# Patient Record
Sex: Female | Born: 1990 | Race: Black or African American | Hispanic: No | Marital: Single | Smoking: Never smoker
Health system: Southern US, Community
[De-identification: ages and names within clinical notes are randomized; demographics above are authoritative.]

## PROBLEM LIST (undated history)

## (undated) DIAGNOSIS — R112 Nausea with vomiting, unspecified: Secondary | ICD-10-CM

## (undated) DIAGNOSIS — Z8679 Personal history of other diseases of the circulatory system: Secondary | ICD-10-CM

## (undated) DIAGNOSIS — Z973 Presence of spectacles and contact lenses: Secondary | ICD-10-CM

## (undated) DIAGNOSIS — R102 Pelvic and perineal pain: Secondary | ICD-10-CM

## (undated) DIAGNOSIS — G43909 Migraine, unspecified, not intractable, without status migrainosus: Secondary | ICD-10-CM

## (undated) DIAGNOSIS — Z8489 Family history of other specified conditions: Secondary | ICD-10-CM

## (undated) DIAGNOSIS — Z9889 Other specified postprocedural states: Secondary | ICD-10-CM

## (undated) DIAGNOSIS — J45909 Unspecified asthma, uncomplicated: Secondary | ICD-10-CM

## (undated) HISTORY — PX: NO PAST SURGERIES: SHX2092

## (undated) HISTORY — PX: REFRACTIVE SURGERY: SHX103

## (undated) HISTORY — PX: OTHER SURGICAL HISTORY: SHX169

---
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
## 2009-04-25 ENCOUNTER — Ambulatory Visit: Payer: Self-pay | Admitting: Internal Medicine

## 2009-04-28 ENCOUNTER — Emergency Department (HOSPITAL_COMMUNITY): Admission: EM | Admit: 2009-04-28 | Discharge: 2009-04-28 | Payer: Self-pay | Admitting: Emergency Medicine

## 2009-04-30 ENCOUNTER — Telehealth (INDEPENDENT_AMBULATORY_CARE_PROVIDER_SITE_OTHER): Payer: Self-pay | Admitting: *Deleted

## 2009-05-06 ENCOUNTER — Ambulatory Visit: Payer: Self-pay | Admitting: Family Medicine

## 2009-05-10 ENCOUNTER — Telehealth: Payer: Self-pay | Admitting: Internal Medicine

## 2009-05-22 ENCOUNTER — Telehealth: Payer: Self-pay | Admitting: Internal Medicine

## 2009-05-24 ENCOUNTER — Ambulatory Visit: Payer: Self-pay | Admitting: Internal Medicine

## 2009-05-27 ENCOUNTER — Telehealth: Payer: Self-pay | Admitting: Internal Medicine

## 2009-10-15 IMAGING — CR DG CHEST 2V
2 series · 2 of 2 positions shown · non-contrast
Comparison: Chest x-ray of 07/18/2007

CLINICAL DATA: Recent cough

CHEST - 2 VIEW

[view not recorded (1 of 2)]
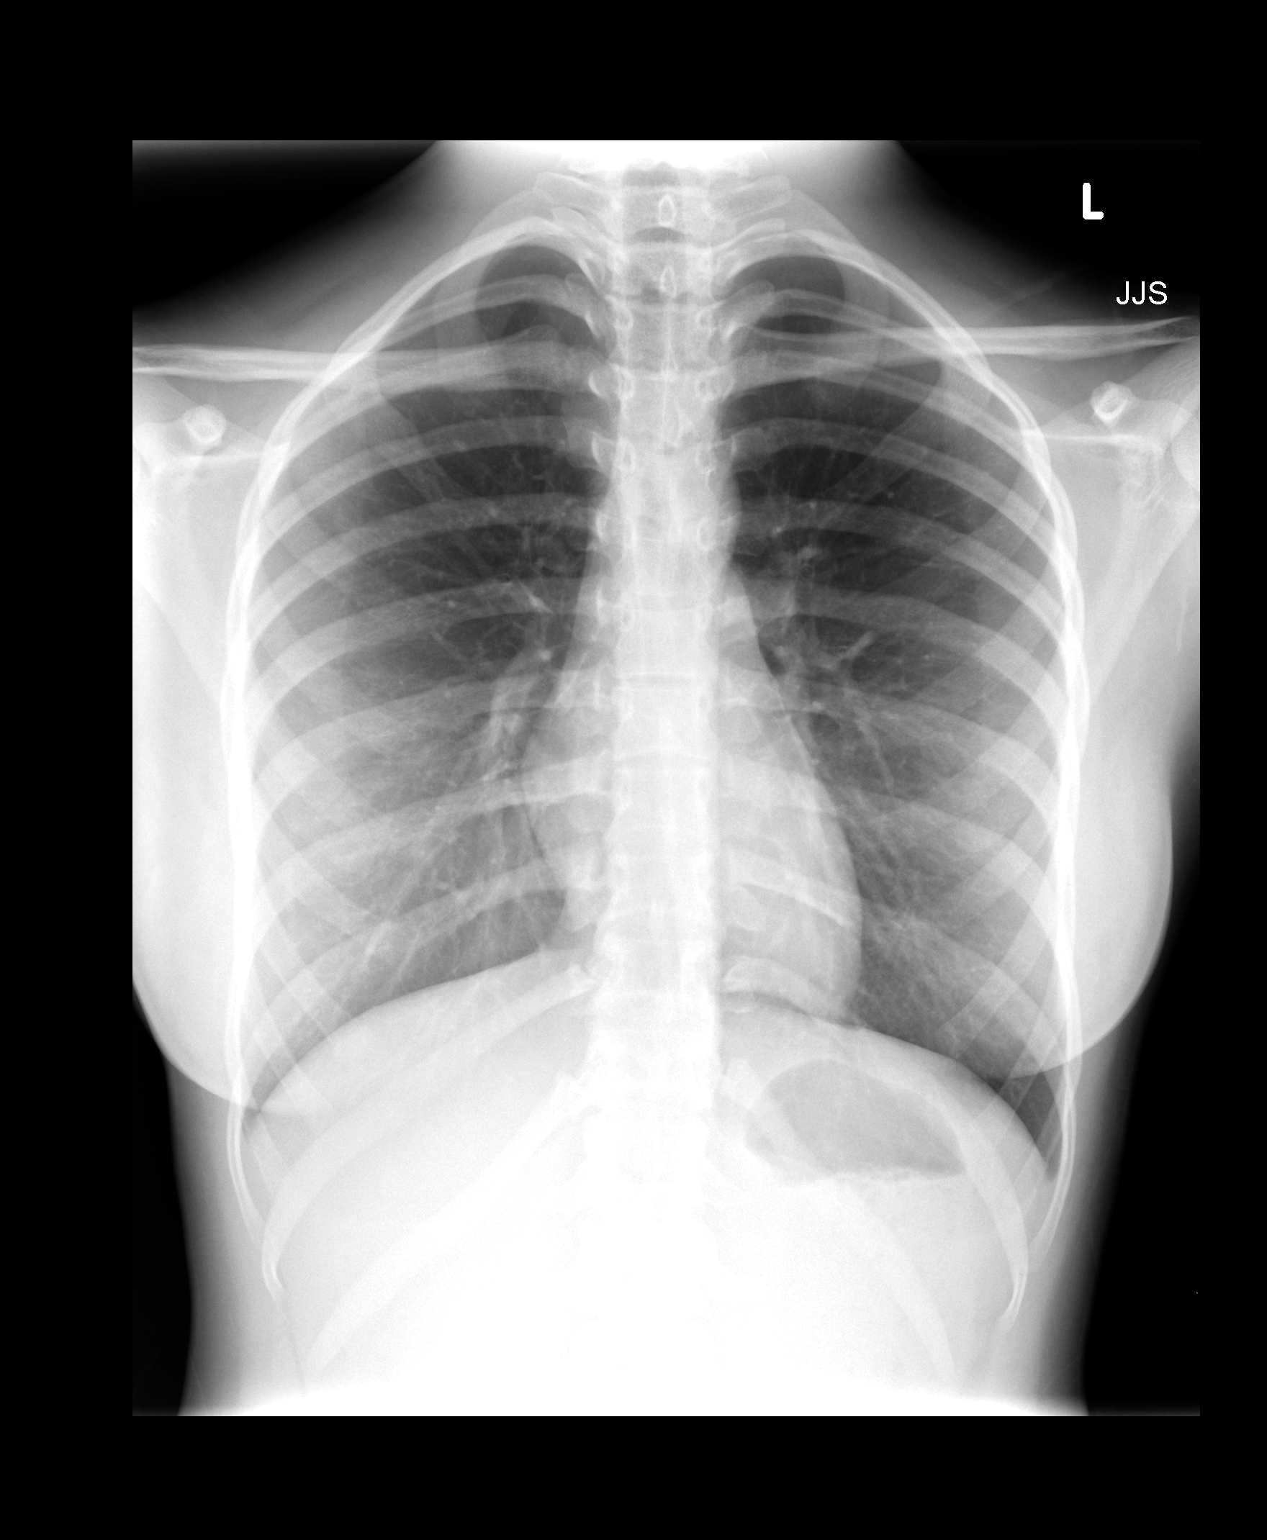

[view not recorded (2 of 2)]
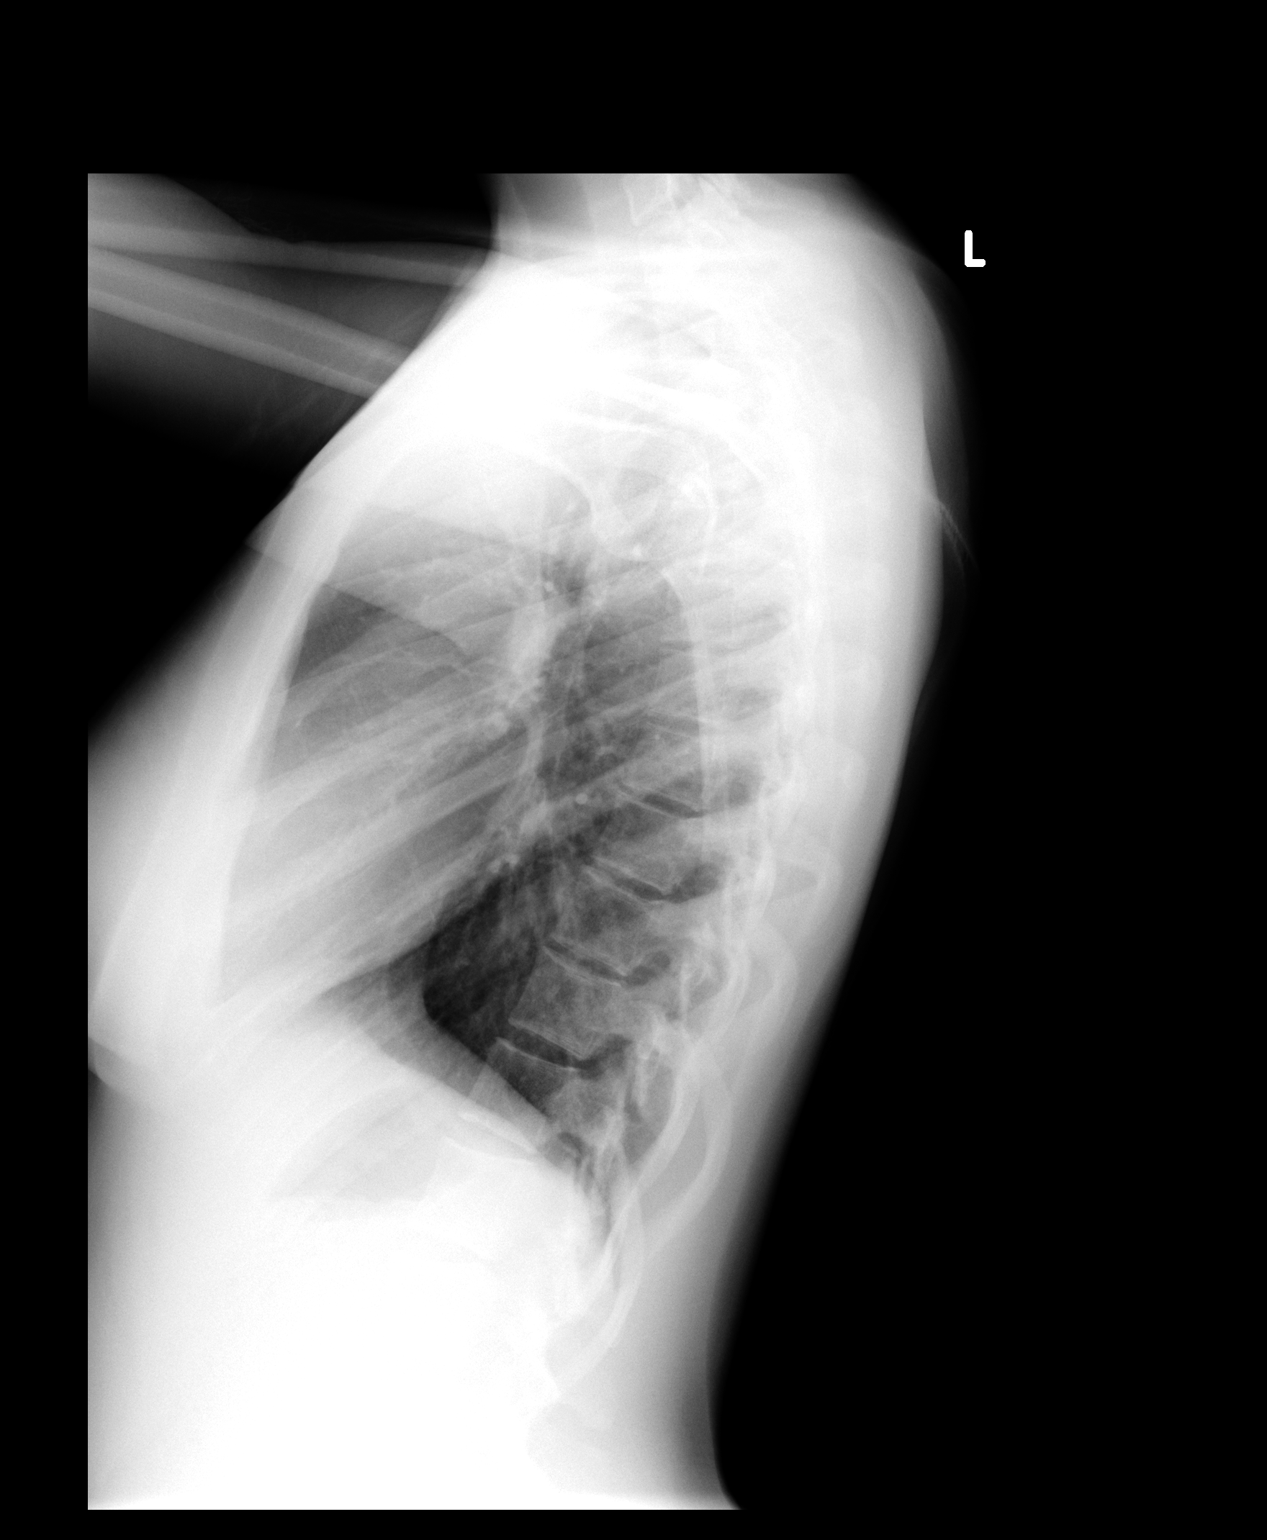

[2 of 2 positions shown; findings below may reference images not displayed]

FINDINGS: The lungs are clear.  Heart is within normal limits in
size.  No bony abnormality is seen.
IMPRESSION: No active lung disease.

## 2009-12-23 ENCOUNTER — Ambulatory Visit: Payer: Self-pay | Admitting: Internal Medicine

## 2010-03-29 IMAGING — CR DG CHEST 2V
2 series · 2 of 2 positions shown · non-contrast
Comparison: 06/26/2008.

CLINICAL DATA: Tachycardia.  Chest pain.

CHEST - 2 VIEW

[w chest pa]
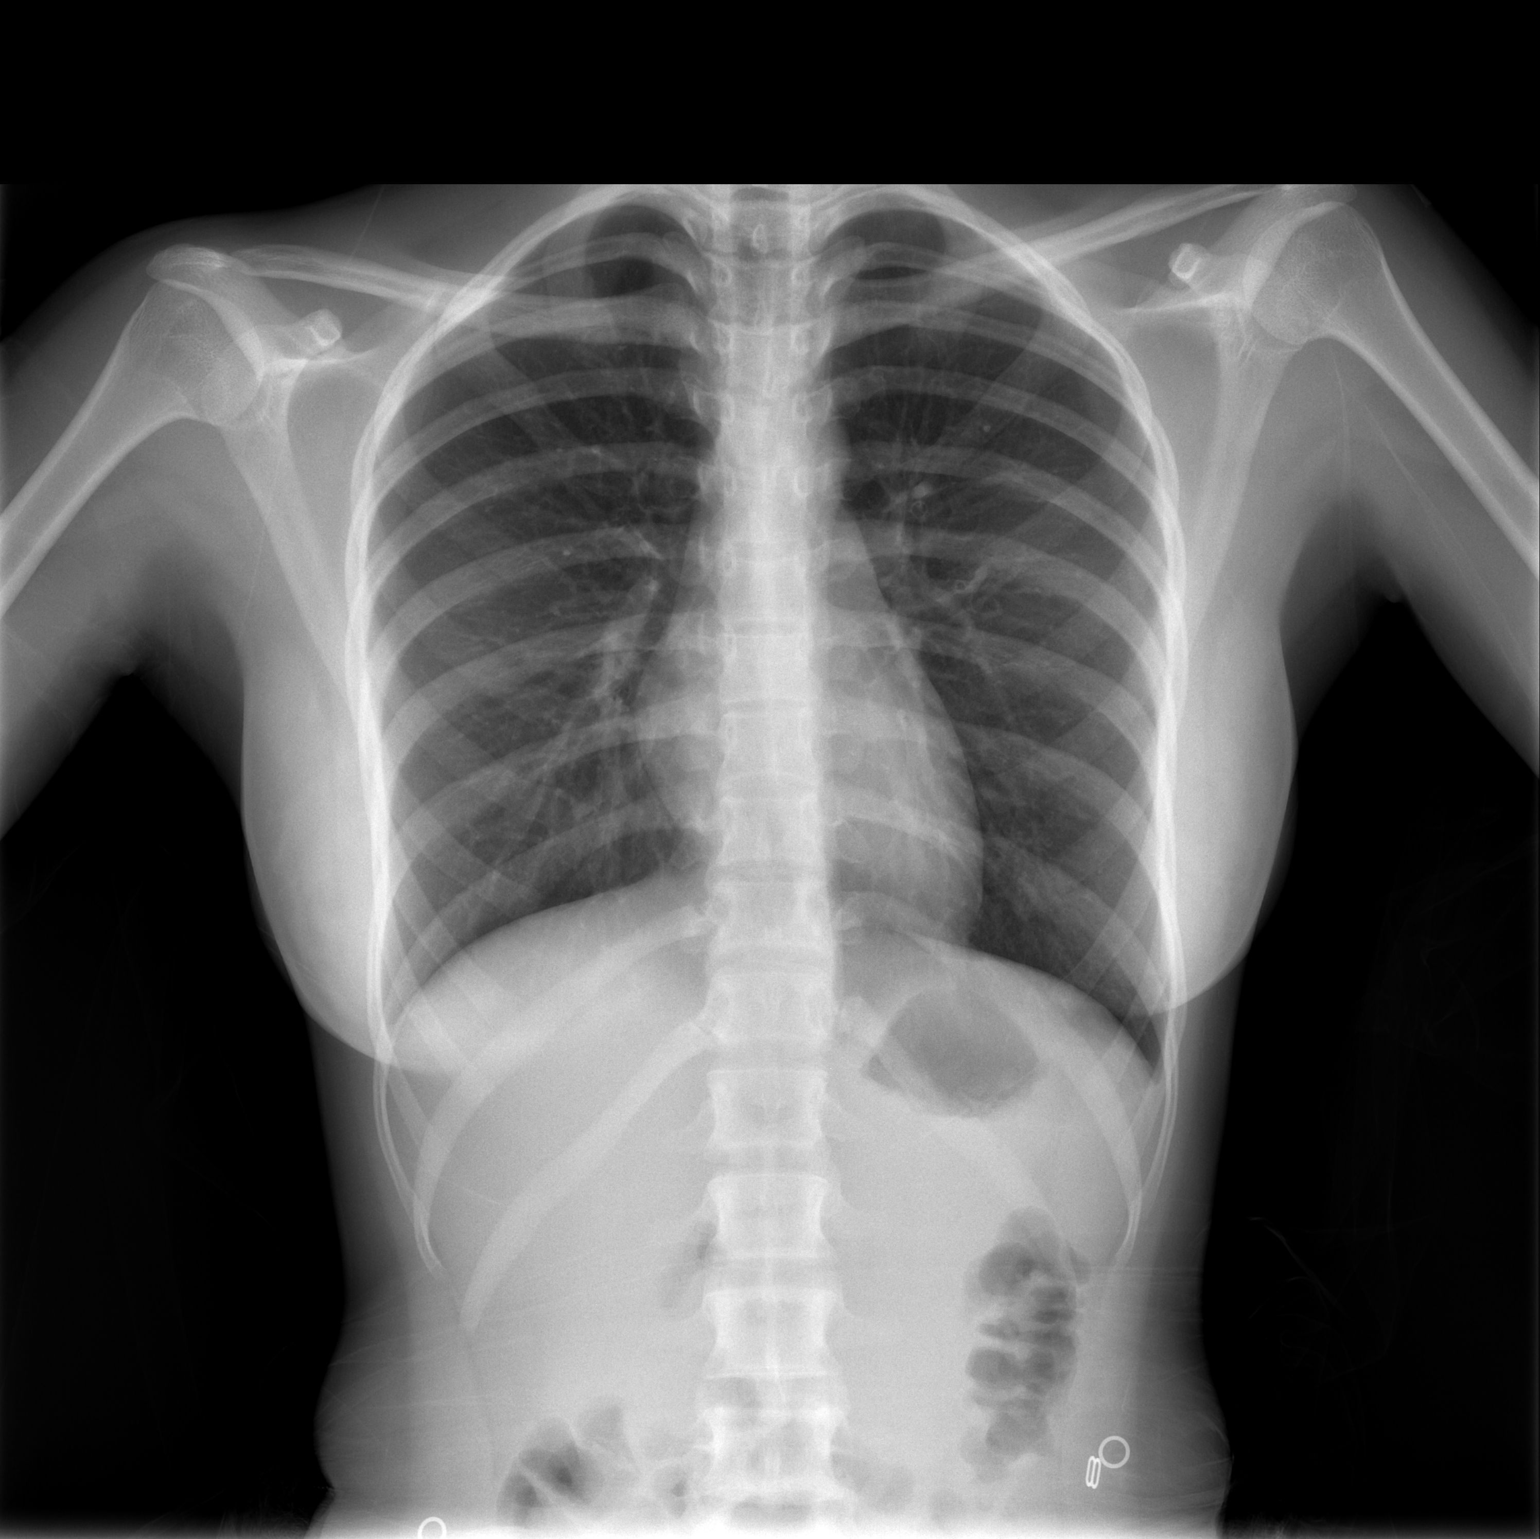

[w chest lat *]
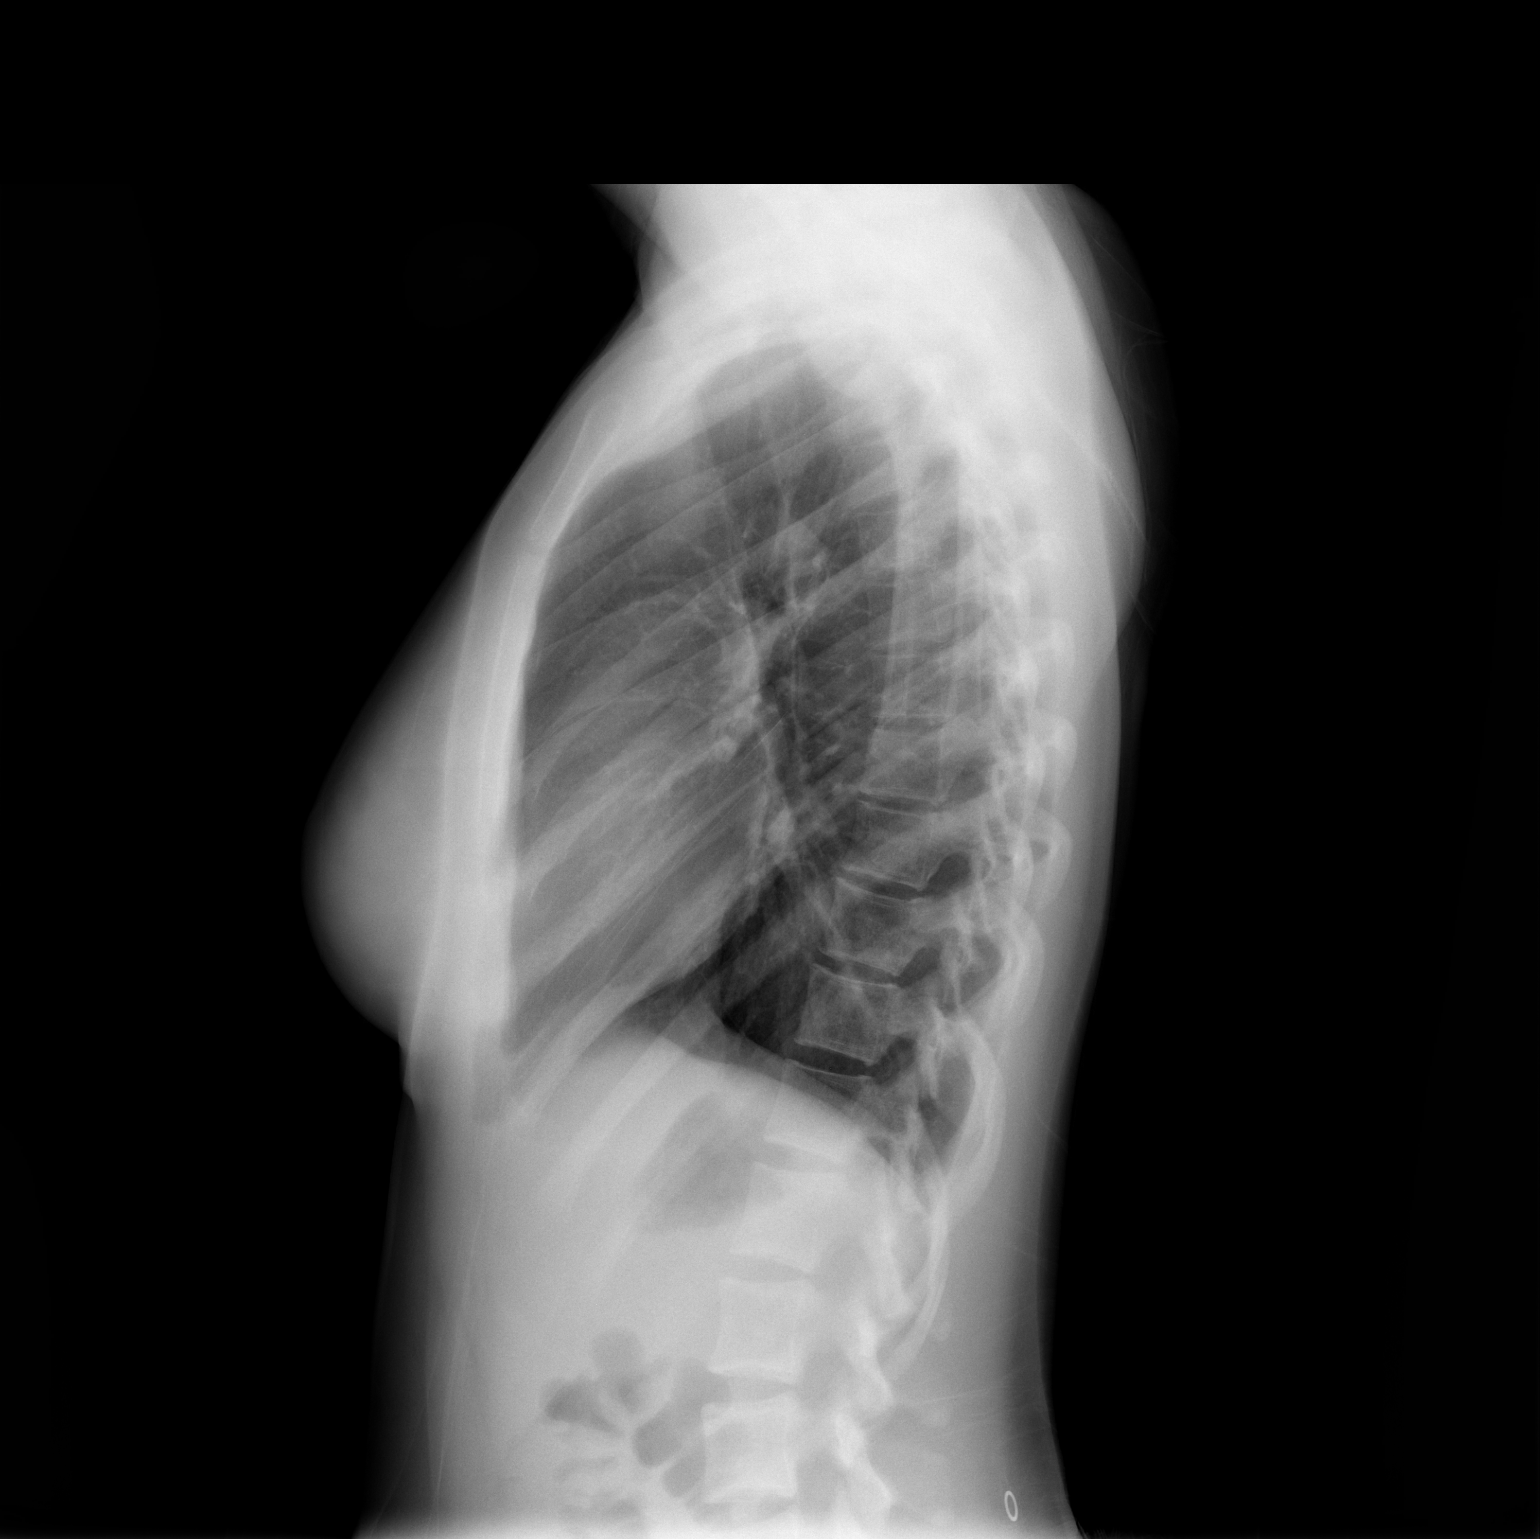

[2 of 2 positions shown; findings below may reference images not displayed]

FINDINGS: The cardiopericardial silhouette is within normal limits
for size.  The lungs are clear.  The visualized soft tissues and
bony thorax are unremarkable.
IMPRESSION: No acute cardiopulmonary disease.

## 2010-04-25 ENCOUNTER — Ambulatory Visit: Payer: Self-pay | Admitting: Internal Medicine

## 2010-04-25 ENCOUNTER — Encounter: Payer: Self-pay | Admitting: Internal Medicine

## 2010-04-29 ENCOUNTER — Ambulatory Visit: Payer: Self-pay | Admitting: Family Medicine

## 2010-11-27 NOTE — Assessment & Plan Note (Signed)
Summary: tb test/njr/pt rsc/cjr  Nurse Visit   Vitals Entered By: Duard Brady LPN (April 25, 8294 11:05 AM)  Allergies: No Known Drug Allergies  Immunizations Administered:  PPD Skin Test:    Vaccine Type: PPD    Site: right forearm    Mfr: Sanofi Pasteur    Dose: 0.1 ml    Route: ID    Given by: Duard Brady LPN    Exp. Date: 08/08/2011    Lot #: [id number]  Orders Added: 1)  TB Skin Test [86580] 2)  Admin 1st Vaccine [phone]

## 2010-11-27 NOTE — Assessment & Plan Note (Signed)
Summary: kim read tb test/cjr  Nurse Visit   Allergies: No Known Drug Allergies  PPD Results    Date of reading: 04/29/2010    Results: < 5mm    Interpretation: negative  Orders Added: 1)  No Charge Patient Arrived (NCPA0) [NCPA0]

## 2010-11-27 NOTE — Letter (Signed)
Summary: [facility]   Imported By: Maryln Gottron 05/29/2010 15:44:19  _____________________________________________________________________  External Attachment:    Type:   Image     Comment:   External Document

## 2010-11-27 NOTE — Assessment & Plan Note (Signed)
Summary: COUGH, CONGESTION // RS   Vital Signs:  Patient profile:   20 year old female Weight:      87 pounds Temp:     97.5 degrees F oral BP sitting:   110 / 78  (right arm) Cuff size:   regular  Vitals Entered By: Duard Brady LPN (December 23, 2009 3:47 PM) CC: c/o cough, congestion, fatigue, fever 100- 101, SOB Is Patient Diabetic? No   CC:  c/o cough, congestion, fatigue, fever 100- 101, and SOB.  History of Present Illness: 20 year old patient who presents with a 3-day  history of fever, cough, congestion, and minimal shortness of breath.  She does history allergic rhinitis and asthma, but the eyes any wheezing denies any sore throat.  She feels a bit weak and fatigued.  She states her temperature has been as high as 101 degrees.  She has been using Tylenol with benefit.  Preventive Screening-Counseling & Management  Alcohol-Tobacco     Smoking Status: never  Allergies (verified): No Known Drug Allergies  Past History:  Past Medical History: Allergic Rhinitis remote hx of eval for syncope neg with cards ? if had slight murmur Sees Derm and Gyne    Hx of pregnancy Asthma  Review of Systems       The patient complains of anorexia, fever, hoarseness, and prolonged cough.  The patient denies weight loss, weight gain, vision loss, decreased hearing, chest pain, syncope, dyspnea on exertion, peripheral edema, headaches, hemoptysis, abdominal pain, melena, hematochezia, severe indigestion/heartburn, hematuria, incontinence, genital sores, muscle weakness, suspicious skin lesions, transient blindness, difficulty walking, depression, unusual weight change, abnormal bleeding, enlarged lymph nodes, angioedema, and breast masses.    Physical Exam  General:  Well-developed,well-nourished,in no acute distress; alert,appropriate and cooperative throughout examination Head:  Normocephalic and atraumatic without obvious abnormalities. No apparent alopecia or balding. Eyes:   No corneal or conjunctival inflammation noted. EOMI. Perrla. Funduscopic exam benign, without hemorrhages, exudates or papilledema. Vision grossly normal. Ears:  External ear exam shows no significant lesions or deformities.  Otoscopic examination reveals clear canals, tympanic membranes are intact bilaterally without bulging, retraction, inflammation or discharge. Hearing is grossly normal bilaterally. Nose:  External nasal examination shows no deformity or inflammation. Nasal mucosa are pink and moist without lesions or exudates. Mouth:  Oral mucosa and oropharynx without lesions or exudates.  Teeth in good repair. Neck:  No deformities, masses, or tenderness noted. Lungs:  Normal respiratory effort, chest expands symmetrically. Lungs are clear to auscultation, no crackles or wheezes. Heart:  Normal rate and regular rhythm. S1 and S2 normal without gallop, murmur, click, rub or other extra sounds.   Impression & Recommendations:  Problem # 1:  ASTHMA (ICD-493.90)  Problem # 2:  URI (ICD-465.9)  Her updated medication list for this problem includes:    Allegra 180 Mg Tabs (Fexofenadine hcl) .[name]... As needed    Hydromet 5-1.5 Mg/76ml Syrp (Hydrocodone-homatropine) .[name]... 1 tsp by mouth two times a day as needed cough    Benzonatate 200 Mg Caps (Benzonatate) .[name]... 1 q 6 hour as needed cough  Her updated medication list for this problem includes:    Allegra 180 Mg Tabs (Fexofenadine hcl) .[name]... As needed    Hydromet 5-1.5 Mg/59ml Syrp (Hydrocodone-homatropine) .[name]... 1 tsp by mouth two times a day as needed cough    Benzonatate 200 Mg Caps (Benzonatate) .[name]... 1 q 6 hour as needed cough  Complete Medication List: 1)  Allegra 180 Mg Tabs (Fexofenadine hcl) .... As needed 2)  Nasacort Aq 55 Mcg/act Aers (Triamcinolone acetonide(nasal)) .... As needed 3)  Hydromet 5-1.5 Mg/9ml Syrp (Hydrocodone-homatropine) .[name].. 1 tsp by mouth two times a day as needed cough 4)  Benzonatate 200 Mg Caps (Benzonatate)  .[name].. 1 q 6 hour as needed cough  Patient Instructions: 1)  Get plenty of rest, drink lots of clear liquids, and use Tylenol or Ibuprofen for fever and comfort. Return in 7-10 days if you're not better:sooner if you're feeling worse. Prescriptions: HYDROMET 5-1.5 MG/5ML SYRP (HYDROCODONE-HOMATROPINE) 1 tsp by mouth two times a day as needed cough  #4 oz x 1   Entered and Authorized by:   Gordy Savers  MD   Signed by:   Gordy Savers  MD on 12/23/2009   Method used:   Print then Give to Patient   RxID:   [id number] NASACORT AQ 55 MCG/ACT  AERS (TRIAMCINOLONE ACETONIDE(NASAL)) as needed  #3 x 3   Entered and Authorized by:   Gordy Savers  MD   Signed by:   Gordy Savers  MD on 12/23/2009   Method used:   Print then Give to Patient   RxID:   [id number] ALLEGRA 180 MG  TABS (FEXOFENADINE HCL) as needed  #90 x 3   Entered and Authorized by:   Gordy Savers  MD   Signed by:   Gordy Savers  MD on 12/23/2009   Method used:   Print then Give to Patient   RxID:   [id number]

## 2010-11-27 NOTE — Letter (Signed)
Summary: Out of School  [place] [facility]  [address] [place], [place] [address]   Phone: [phone]  Fax: [phone]    December 23, 2009   Student:  Kylie Olson    To Whom It May Concern:   For Medical reasons, please excuse the above named student from school for the following dates:  Start:   December 23, 2009  End:    December 25, 2009  If you need additional information, please feel free to contact our office.   Sincerely,    Gordy Savers  MD    ****This is a legal document and cannot be tampered with.  Schools are authorized to verify all information and to do so accordingly.

## 2011-02-10 LAB — CBC
HCT: 41.6 % (ref 36.0–46.0)
Hemoglobin: 13.5 g/dL (ref 12.0–15.0)
MCV: 84.9 fL (ref 78.0–100.0)
Platelets: 252 10*3/uL (ref 150–400)
RDW: 14.1 % (ref 11.5–15.5)

## 2011-02-10 LAB — DIFFERENTIAL
Basophils Relative: 1 % (ref 0–1)
Eosinophils Absolute: 0 10*3/uL (ref 0.0–0.7)
Lymphocytes Relative: 4 % — ABNORMAL LOW (ref 12–46)
Monocytes Absolute: 0.3 10*3/uL (ref 0.1–1.0)
Neutrophils Relative %: 93 % — ABNORMAL HIGH (ref 43–77)

## 2011-02-10 LAB — COMPREHENSIVE METABOLIC PANEL
Albumin: 4.6 g/dL (ref 3.5–5.2)
Alkaline Phosphatase: 79 U/L (ref 39–117)
BUN: 10 mg/dL (ref 6–23)
Chloride: 106 mEq/L (ref 96–112)
Creatinine, Ser: 0.58 mg/dL (ref 0.4–1.2)
Glucose, Bld: 124 mg/dL — ABNORMAL HIGH (ref 70–99)
Total Bilirubin: 0.7 mg/dL (ref 0.3–1.2)
Total Protein: 8.5 g/dL — ABNORMAL HIGH (ref 6.0–8.3)

## 2011-02-10 LAB — URINE CULTURE

## 2011-02-10 LAB — URINALYSIS, ROUTINE W REFLEX MICROSCOPIC
Bilirubin Urine: NEGATIVE
Glucose, UA: NEGATIVE mg/dL
Hgb urine dipstick: NEGATIVE
Ketones, ur: NEGATIVE mg/dL
pH: 6 (ref 5.0–8.0)

## 2011-02-10 LAB — URINE MICROSCOPIC-ADD ON

## 2011-05-21 ENCOUNTER — Emergency Department (HOSPITAL_COMMUNITY)
Admission: EM | Admit: 2011-05-21 | Discharge: 2011-05-21 | Disposition: A | Discharge status: Home / Self Care | Payer: BC Managed Care – PPO | Attending: Emergency Medicine | Admitting: Emergency Medicine

## 2011-05-21 ENCOUNTER — Emergency Department (HOSPITAL_COMMUNITY): Payer: BC Managed Care – PPO

## 2011-05-21 DIAGNOSIS — N946 Dysmenorrhea, unspecified: Secondary | ICD-10-CM | POA: Insufficient documentation

## 2011-05-21 DIAGNOSIS — R109 Unspecified abdominal pain: Secondary | ICD-10-CM | POA: Insufficient documentation

## 2011-05-21 DIAGNOSIS — R112 Nausea with vomiting, unspecified: Secondary | ICD-10-CM | POA: Insufficient documentation

## 2011-05-21 DIAGNOSIS — N949 Unspecified condition associated with female genital organs and menstrual cycle: Secondary | ICD-10-CM | POA: Insufficient documentation

## 2011-05-21 LAB — POCT PREGNANCY, URINE: Preg Test, Ur: NEGATIVE

## 2011-05-21 LAB — CBC
HCT: 42.4 % (ref 36.0–46.0)
MCH: 27.8 pg (ref 26.0–34.0)
MCHC: 33.3 g/dL (ref 30.0–36.0)
MCV: 83.6 fL (ref 78.0–100.0)
RDW: 13.1 % (ref 11.5–15.5)

## 2011-05-21 LAB — POCT I-STAT, CHEM 8
Calcium, Ion: 0.94 mmol/L — ABNORMAL LOW (ref 1.12–1.32)
Glucose, Bld: 92 mg/dL (ref 70–99)
HCT: 47 % — ABNORMAL HIGH (ref 36.0–46.0)
Hemoglobin: 16 g/dL — ABNORMAL HIGH (ref 12.0–15.0)

## 2011-05-21 LAB — DIFFERENTIAL
Eosinophils Relative: 1 % (ref 0–5)
Lymphocytes Relative: 14 % (ref 12–46)
Lymphs Abs: 2.4 10*3/uL (ref 0.7–4.0)
Monocytes Absolute: 1 10*3/uL (ref 0.1–1.0)

## 2011-05-21 LAB — URINALYSIS, ROUTINE W REFLEX MICROSCOPIC
Bilirubin Urine: NEGATIVE
Ketones, ur: NEGATIVE mg/dL
Nitrite: NEGATIVE
Urobilinogen, UA: 0.2 mg/dL (ref 0.0–1.0)
pH: 6 (ref 5.0–8.0)

## 2011-08-06 LAB — PROTIME-INR: INR: 1

## 2011-08-12 LAB — URINALYSIS, ROUTINE W REFLEX MICROSCOPIC
Glucose, UA: NEGATIVE
Leukocytes, UA: NEGATIVE
Nitrite: NEGATIVE
Specific Gravity, Urine: 1.018
pH: 6

## 2012-03-09 ENCOUNTER — Encounter: Payer: Self-pay | Admitting: Obstetrics and Gynecology

## 2012-03-09 ENCOUNTER — Ambulatory Visit (INDEPENDENT_AMBULATORY_CARE_PROVIDER_SITE_OTHER): Payer: BC Managed Care – PPO | Admitting: Obstetrics and Gynecology

## 2012-03-09 VITALS — BP 110/70 | Temp 98.5°F | Ht <= 58 in | Wt 91.0 lb

## 2012-03-09 DIAGNOSIS — R102 Pelvic and perineal pain: Secondary | ICD-10-CM

## 2012-03-09 DIAGNOSIS — N946 Dysmenorrhea, unspecified: Secondary | ICD-10-CM

## 2012-03-09 DIAGNOSIS — N949 Unspecified condition associated with female genital organs and menstrual cycle: Secondary | ICD-10-CM

## 2012-03-09 DIAGNOSIS — N898 Other specified noninflammatory disorders of vagina: Secondary | ICD-10-CM

## 2012-03-09 DIAGNOSIS — R300 Dysuria: Secondary | ICD-10-CM

## 2012-03-09 LAB — POCT WET PREP (WET MOUNT)

## 2012-03-09 LAB — POCT URINALYSIS DIPSTICK
Glucose, UA: NEGATIVE
Nitrite, UA: NEGATIVE
Urobilinogen, UA: NEGATIVE

## 2012-03-09 LAB — POCT URINE PREGNANCY: Preg Test, Ur: NEGATIVE

## 2012-03-09 NOTE — Progress Notes (Signed)
20 YO complains of severe dysmenorrhea that will cause her to faint accompanied by heavy flow. These worsening symptoms have been for 6 months.  Changes pad every 3 hours during 3 day flow.  Before only used 2 pads daily. Has tried Ibuprofen 600 mg that originally helped but later stopped. Same goes for Percocet. Denies uti symptoms, changes in bowel movements, vaginitis symptoms or fever.  O: Abdomen:  Pelvic: EGBUS-wnl, vagina-copious yellow discharge, cervix-no lesions, uterus-normal size without tenderness, adnexae-no tenderness/masses     Wet Prep:  pH-5.5,  whiff +, no yeast, trich or clue cells  UPT-negative  U/A pH 5.0, SG 1.010,  leukocytes 2+  Normal U/[facility] July 2012  A: Worsening Dysmenorrhea     Increased Menstrual Volume  P: urine for culture      Reviewed management options for dysmenorrhea and     menses volume-hormonal, herbal and supplemental     Take NSAIDs 2 days before menses as directed, with    food x 4 days     Reviewed BCPs/Nuvaring/OrthoEvra Patch dosing along    with side effects and risks of VTE events-patient is con-    sidering     Reviewed endometriosis, diagnosis, natural history,     management options and complications

## 2012-03-09 NOTE — Progress Notes (Signed)
Vaginal Discharge: no Kidney Stones: no Prior Eval: no  Odor: no Constipation: no Prior U/S: yes 05/21/2011  Fever: no Diarrhea: no Hx of Ovarian Cyst: no  Irreg. Periods: no Rectal Bleeding: no Hx of STD-PID: no  Dyspareunia: no Vomiting: yes during cycle Appendectomy: no   Dysuria: no Nausea: yes during cycle Gall Bladder Ds: no  Frequency: no Pregnant: no Other: during cycle have severe pelvic pain and vaginal pain to the point where pt will pass out. Pt c/o hvy cyles  Urgency: no Fibroids: no   Hematuria: no Endometriosis: no

## 2012-03-09 NOTE — Progress Notes (Signed)
Addended byWinfred Leeds on: 03/09/2012 05:18 PM   Modules accepted: Orders

## 2012-03-09 NOTE — Patient Instructions (Addendum)
  Give brochure on endometriosis and Lupron if available  Take Ibuprofen 2 days before period begins x 4 days (always with food) Drink plenty of liquids

## 2012-03-11 LAB — URINE CULTURE
Colony Count: NO GROWTH
Organism ID, Bacteria: NO GROWTH

## 2012-05-17 ENCOUNTER — Telehealth: Payer: Self-pay | Admitting: Obstetrics and Gynecology

## 2012-05-17 NOTE — Telephone Encounter (Signed)
Spoke with pt rgd rx refill on pain meds. Pt stated she wants a refill on percocet. Routed msg to ep to get approval on meds. Advised pt needed to get approval on rx and once its approved she will be notified . Pt's voice understanding.

## 2012-05-17 NOTE — Telephone Encounter (Signed)
Patient with severe dysmenorrhea previously given NSAIDs for management calls to request stronger pain medication because the Ibuprofen is no longer effective. Patient needs an appointment with a physician as soon as possible. to discuss long term management of dysmemorrhea.  She may be given Vicodin # 30 1-2 every 4 hours as needed for moderate to severe pain with no refills.  Marieme Mcmackin, PA-C

## 2012-05-18 ENCOUNTER — Telehealth: Payer: Self-pay | Admitting: Obstetrics and Gynecology

## 2012-05-18 NOTE — Telephone Encounter (Signed)
Per EP approved rx refill request for pain meds. Called [facility] 5/300 dips 30 1-2 every 4 hours prn with 0 refills . Left msg on pt's voice mail to call back. Per ep pt needs to make an app with a provider .

## 2012-05-18 NOTE — Telephone Encounter (Signed)
Spoke with pt rgd rx request. Told pt i had called in pain meds per EP. To [facility] pharmacy. Also advised pt that ep wanted her to make an appt with a provider. Pt stated that she would call back to schedule app due to school schedule. Pt's voice understanding. bt cma

## 2012-07-31 ENCOUNTER — Emergency Department (HOSPITAL_BASED_OUTPATIENT_CLINIC_OR_DEPARTMENT_OTHER)
Admission: EM | Admit: 2012-07-31 | Discharge: 2012-07-31 | Disposition: A | Discharge status: Home / Self Care | Payer: BC Managed Care – PPO | Attending: Emergency Medicine | Admitting: Emergency Medicine

## 2012-07-31 ENCOUNTER — Emergency Department (HOSPITAL_BASED_OUTPATIENT_CLINIC_OR_DEPARTMENT_OTHER): Payer: BC Managed Care – PPO

## 2012-07-31 ENCOUNTER — Encounter (HOSPITAL_BASED_OUTPATIENT_CLINIC_OR_DEPARTMENT_OTHER): Payer: Self-pay | Admitting: Emergency Medicine

## 2012-07-31 DIAGNOSIS — J45909 Unspecified asthma, uncomplicated: Secondary | ICD-10-CM | POA: Insufficient documentation

## 2012-07-31 DIAGNOSIS — R109 Unspecified abdominal pain: Secondary | ICD-10-CM | POA: Insufficient documentation

## 2012-07-31 LAB — URINALYSIS, ROUTINE W REFLEX MICROSCOPIC
Bilirubin Urine: NEGATIVE
Ketones, ur: NEGATIVE mg/dL
Nitrite: NEGATIVE
Protein, ur: NEGATIVE mg/dL
pH: 6.5 (ref 5.0–8.0)

## 2012-07-31 LAB — CBC WITH DIFFERENTIAL/PLATELET
Eosinophils Relative: 1 % (ref 0–5)
Lymphocytes Relative: 12 % (ref 12–46)
Lymphs Abs: 1.5 10*3/uL (ref 0.7–4.0)
MCV: 81.3 fL (ref 78.0–100.0)
Neutrophils Relative %: 78 % — ABNORMAL HIGH (ref 43–77)
Platelets: 241 10*3/uL (ref 150–400)
RBC: 4.28 MIL/uL (ref 3.87–5.11)
WBC: 12.5 10*3/uL — ABNORMAL HIGH (ref 4.0–10.5)

## 2012-07-31 LAB — COMPREHENSIVE METABOLIC PANEL
ALT: 15 U/L (ref 0–35)
Alkaline Phosphatase: 83 U/L (ref 39–117)
CO2: 20 mEq/L (ref 19–32)
GFR calc Af Amer: >90 mL/min (ref >90)
GFR calc non Af Amer: >90 mL/min (ref >90)
Glucose, Bld: 101 mg/dL — ABNORMAL HIGH (ref 70–99)
Potassium: 3.8 mEq/L (ref 3.5–5.1)
Sodium: 137 mEq/L (ref 135–145)
Total Bilirubin: 0.2 mg/dL — ABNORMAL LOW (ref 0.3–1.2)

## 2012-07-31 LAB — URINE MICROSCOPIC-ADD ON

## 2012-07-31 MED ORDER — IOHEXOL 300 MG/ML  SOLN
100.0000 mL | Freq: Once | INTRAMUSCULAR | Status: AC | PRN
  Administered 2012-07-31: 100 mL via INTRAVENOUS

## 2012-07-31 MED ORDER — IOHEXOL 300 MG/ML  SOLN
20.0000 mL | INTRAMUSCULAR | Status: AC
  Administered 2012-07-31: 20 mL via ORAL

## 2012-07-31 MED ORDER — SODIUM CHLORIDE 0.9 % IV SOLN
INTRAVENOUS | Status: DC
  Administered 2012-07-31: 05:00:00 via INTRAVENOUS

## 2012-07-31 MED ORDER — IBUPROFEN 600 MG PO TABS: 600.0000 mg | ORAL_TABLET | Freq: Four times a day (QID) | ORAL | Status: DC | PRN

## 2012-07-31 NOTE — ED Provider Notes (Signed)
History     CSN: [id number]  Arrival date & time 07/31/12  9528   First MD Initiated Contact with Patient 07/31/12 [phone]      Chief Complaint  Patient presents with  . Abdominal Pain    (Consider location/radiation/quality/duration/timing/severity/associated sxs/prior treatment) HPI Is a 21 year old black female with a three-day history of right-sided abdominal pain. The pain is located along the midaxillary line radiating around to the right flank. The pain is moderate to severe. It is worse with movement. She describes it as feeling like muscles or twisting. She's not aware of any injury. There is been no fever, chills, nausea, vomiting, diarrhea, dysuria, hematuria, vaginal bleeding, vaginal discharge or anorexia.  Past Medical History  Diagnosis Date  . Asthma     exercised induce    Past Surgical History  Procedure Date  . Refractive surgery     Family History  Problem Relation Age of Onset  . Asthma Maternal Grandmother   . Cancer Maternal Grandmother     PANCREATIC  . Hypertension Maternal Grandmother   . Hypertension Maternal Grandfather   . Heart disease Maternal Grandfather     quatrupal bypass   . Irritable bowel syndrome Mother   . Hypertension Mother   . Thyroid disease Maternal Uncle   . Hypertension Maternal Aunt   . Lupus Maternal Aunt   . Diabetes Maternal Aunt     History  Substance Use Topics  . Smoking status: Never Smoker   . Smokeless tobacco: Never Used  . Alcohol Use: No    OB History    Grav Para Term Preterm Abortions TAB SAB Ect Mult Living   1 0   1     0      Review of Systems  All other systems reviewed and are negative.    Allergies  Review of patient's allergies indicates no known allergies.  Home Medications   Current Outpatient Rx  Name Route Sig Dispense Refill  . FLAXSEED OIL PO Oral Take by mouth.    . ONE-DAILY MULTI VITAMINS PO TABS Oral Take 1 tablet by mouth daily.      BP 121/75  Pulse 94  Temp 98.5  F (36.9 C) (Oral)  Resp 20  Ht 4\' 11"  (1.499 m)  Wt 103 lb 2 oz (46.777 kg)  BMI 20.83 kg/m2  SpO2 100%  LMP 07/17/2012  Physical Exam General: Well-developed, well-nourished female in no acute distress; appearance consistent with age of record HENT: normocephalic, atraumatic Eyes: pupils equal round and reactive to light; extraocular muscles intact Neck: supple Heart: regular rate and rhythm Lungs: clear to auscultation bilaterally Abdomen: soft; nondistended; tenderness right lateral abdomen and right flank; no tenderness at McBurney's point; negative Murphy's sign; no masses or hepatosplenomegaly; bowel sounds present Extremities: No deformity; full range of motion Neurologic: Awake, alert and oriented; motor function intact in all extremities and symmetric; no facial droop Skin: Warm and dry Psychiatric: Normal mood and affect    ED Course  Procedures (including critical care time)     MDM   Nursing notes and vitals signs, including pulse oximetry, reviewed.  Summary of this visit's results, reviewed by myself:  Labs:  Results for orders placed during the hospital encounter of 07/31/12  URINALYSIS, ROUTINE W REFLEX MICROSCOPIC      Component Value Range   Color, Urine YELLOW  YELLOW   APPearance CLEAR  CLEAR   Specific Gravity, Urine 1.009  1.005 - 1.030   pH 6.5  5.0 - 8.0  Glucose, UA NEGATIVE  NEGATIVE mg/dL   Hgb urine dipstick NEGATIVE  NEGATIVE   Bilirubin Urine NEGATIVE  NEGATIVE   Ketones, ur NEGATIVE  NEGATIVE mg/dL   Protein, ur NEGATIVE  NEGATIVE mg/dL   Urobilinogen, UA 0.2  0.0 - 1.0 mg/dL   Nitrite NEGATIVE  NEGATIVE   Leukocytes, UA SMALL (*) NEGATIVE  PREGNANCY, URINE      Component Value Range   Preg Test, Ur NEGATIVE  NEGATIVE  URINE MICROSCOPIC-ADD ON      Component Value Range   Squamous Epithelial / LPF FEW (*) RARE   WBC, UA 3-6  <3 WBC/hpf   RBC / HPF 0-2  <3 RBC/hpf   Bacteria, UA FEW (*) RARE  CBC WITH DIFFERENTIAL       Component Value Range   WBC 12.5 (*) 4.0 - 10.5 K/uL   RBC 4.28  3.87 - 5.11 MIL/uL   Hemoglobin 12.0  12.0 - 15.0 g/dL   HCT 16.1 (*) 09.6 - 04.5 %   MCV 81.3  78.0 - 100.0 fL   MCH 28.0  26.0 - 34.0 pg   MCHC 34.5  30.0 - 36.0 g/dL   RDW 40.9  81.1 - 91.4 %   Platelets 241  150 - 400 K/uL   Neutrophils Relative 78 (*) 43 - 77 %   Neutro Abs 9.8 (*) 1.7 - 7.7 K/uL   Lymphocytes Relative 12  12 - 46 %   Lymphs Abs 1.5  0.7 - 4.0 K/uL   Monocytes Relative 9  3 - 12 %   Monocytes Absolute 1.1 (*) 0.1 - 1.0 K/uL   Eosinophils Relative 1  0 - 5 %   Eosinophils Absolute 0.1  0.0 - 0.7 K/uL   Basophils Relative 0  0 - 1 %   Basophils Absolute 0.0  0.0 - 0.1 K/uL  COMPREHENSIVE METABOLIC PANEL      Component Value Range   Sodium 137  135 - 145 mEq/L   Potassium 3.8  3.5 - 5.1 mEq/L   Chloride 103  96 - 112 mEq/L   CO2 20  19 - 32 mEq/L   Glucose, Bld 101 (*) 70 - 99 mg/dL   BUN 10  6 - 23 mg/dL   Creatinine, Ser 7.82  0.50 - 1.10 mg/dL   Calcium 95.6  8.4 - 21.3 mg/dL   Total Protein 8.4 (*) 6.0 - 8.3 g/dL   Albumin 4.1  3.5 - 5.2 g/dL   AST 25  0 - 37 U/L   ALT 15  0 - 35 U/L   Alkaline Phosphatase 83  39 - 117 U/L   Total Bilirubin 0.2 (*) 0.3 - 1.2 mg/dL   GFR calc non Af Amer >90  >90 mL/min   GFR calc Af Amer >90  >90 mL/min    Imaging Studies: No results found.  7:04 AM CT pending. Dr. Ranae Palms to review and make disposition.         Hanley Seamen, MD 07/31/12 [phone]

## 2012-07-31 NOTE — ED Notes (Signed)
Completed contrast.  Awaiting CT scan.

## 2012-07-31 NOTE — ED Notes (Signed)
Patient gave urine sample, I took to lab. 

## 2012-07-31 NOTE — ED Provider Notes (Signed)
CT normal. Pt is comfortable. Likely musculoskeletal pain. Advised NSAID's and rest. Return for concerns  Loren Racer, MD 07/31/12 [phone]

## 2012-07-31 NOTE — ED Notes (Signed)
Pt c/o right sided abd pain.denies n/v/d and dysuria

## 2012-07-31 NOTE — ED Notes (Signed)
MD at bedside. 

## 2012-07-31 NOTE — ED Notes (Signed)
Patient transported to CT 

## 2012-08-30 ENCOUNTER — Telehealth: Payer: Self-pay | Admitting: Obstetrics and Gynecology

## 2012-08-30 NOTE — Telephone Encounter (Signed)
Spoke with pt rgs msg. Pt stated that she wants to start B/C . Advised pt that she would need to make a app. Pt stated she will call back in the morning to make a app. Pt's voice understanding.

## 2012-08-30 NOTE — Telephone Encounter (Signed)
Birth Control

## 2012-09-08 IMAGING — US US PELVIS COMPLETE
1 series · 13 of 25 positions shown · non-contrast
Comparison: None.

CLINICAL DATA: 2 episodes of pelvic cramping recently.  LMP
currently.

TRANSABDOMINAL AND TRANSVAGINAL ULTRASOUND OF PELVIS
DOPPLER ULTRASOUND OF OVARIES
TECHNIQUE: Both transabdominal and transvaginal ultrasound
examinations of the pelvis were performed. Transabdominal technique
was performed for global imaging of the pelvis including uterus,
ovaries, adnexal regions, and pelvic cul-de-sac.

[Series 1: us pelvis complete · 0.30mm/px · 46 acquisitions, 13 frames shown]
[im 1/46]
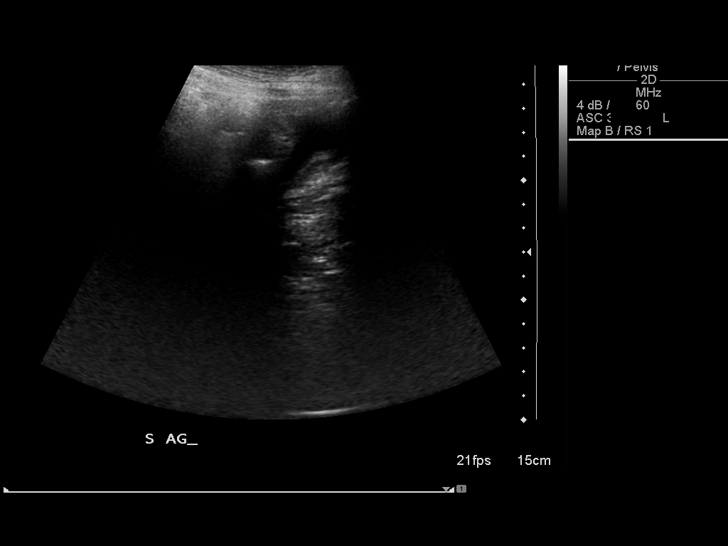
[im 4/46]
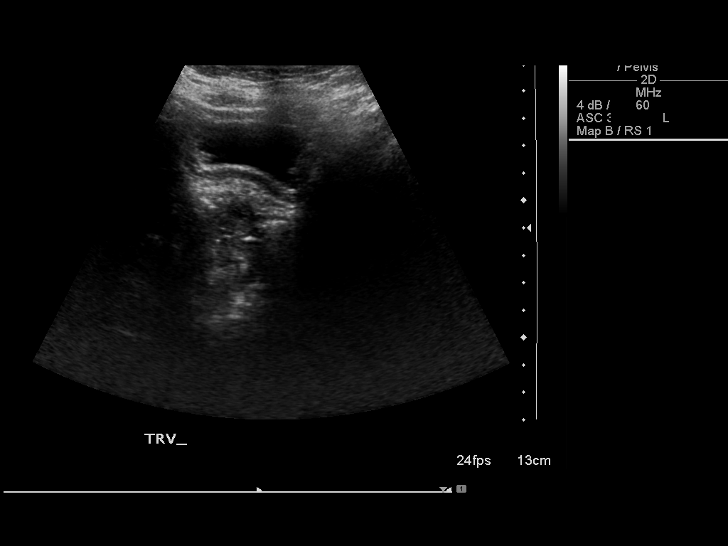
[im 8/46]
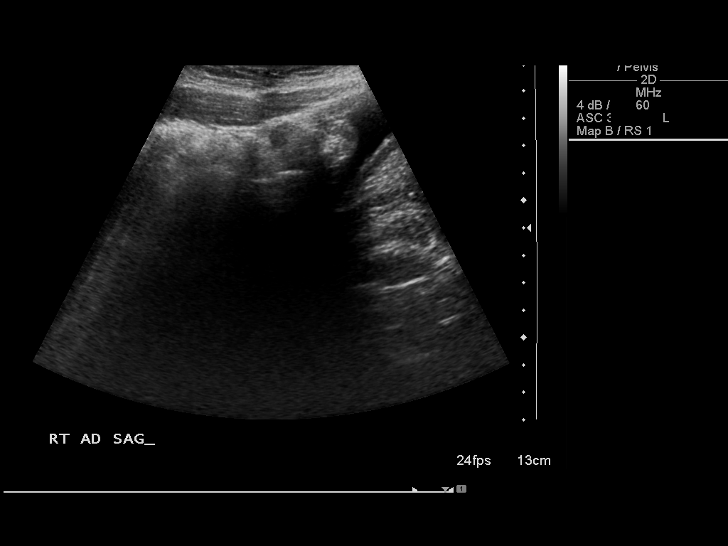
[im 12/46]
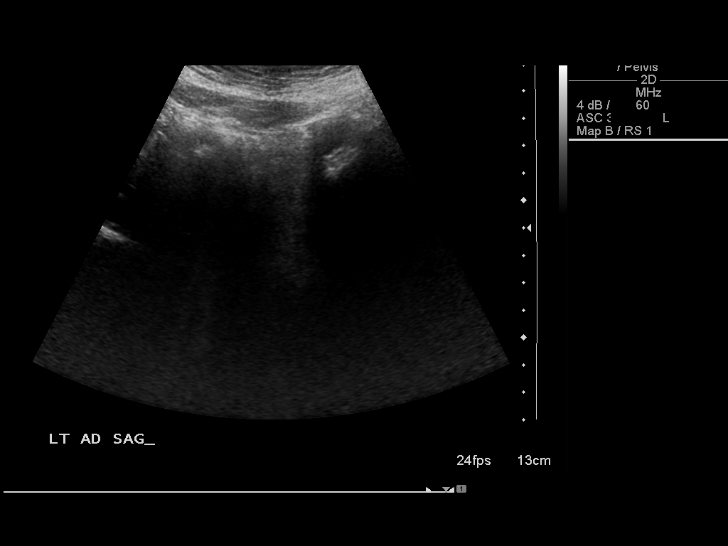
[im 16/46]
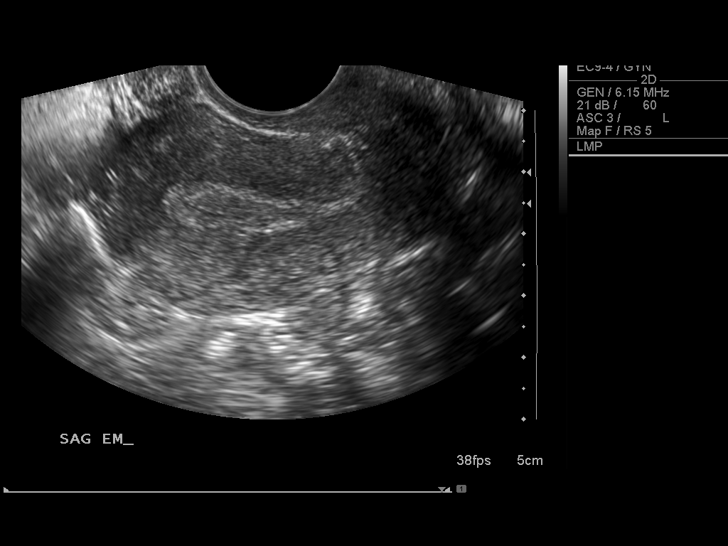
[im 19/46]
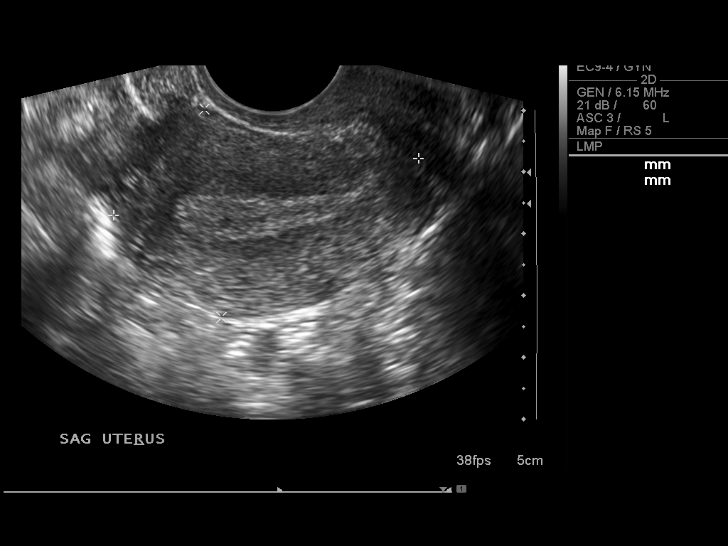
[im 23/46]
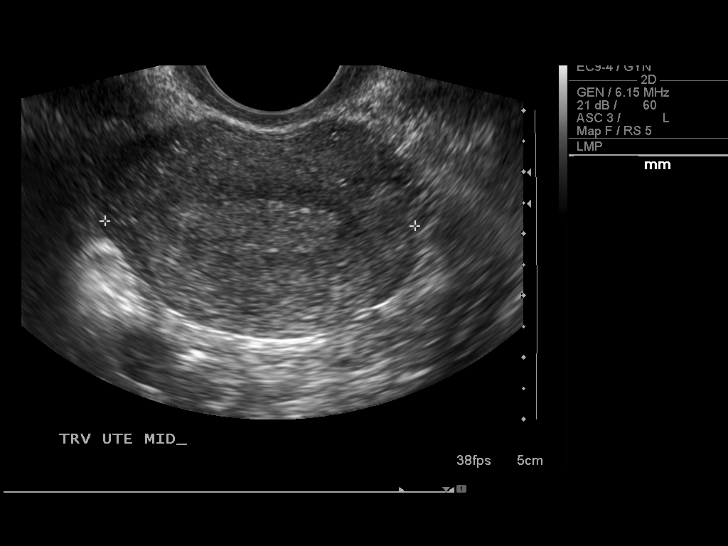
[im 27/46]
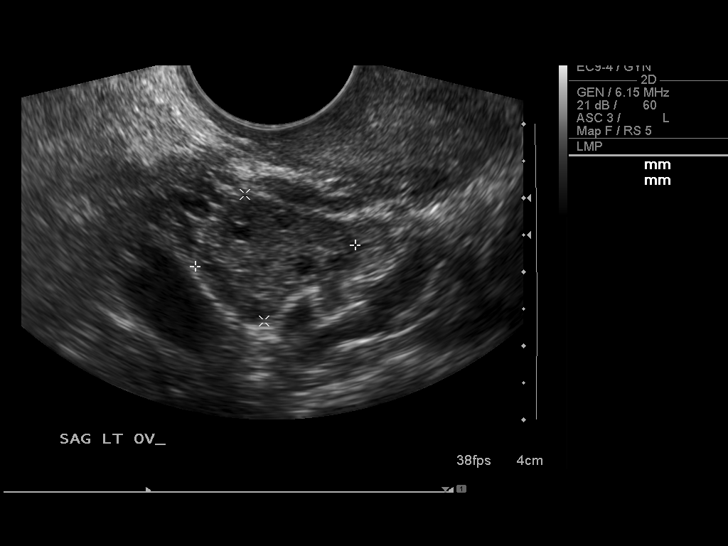
[im 31/46]
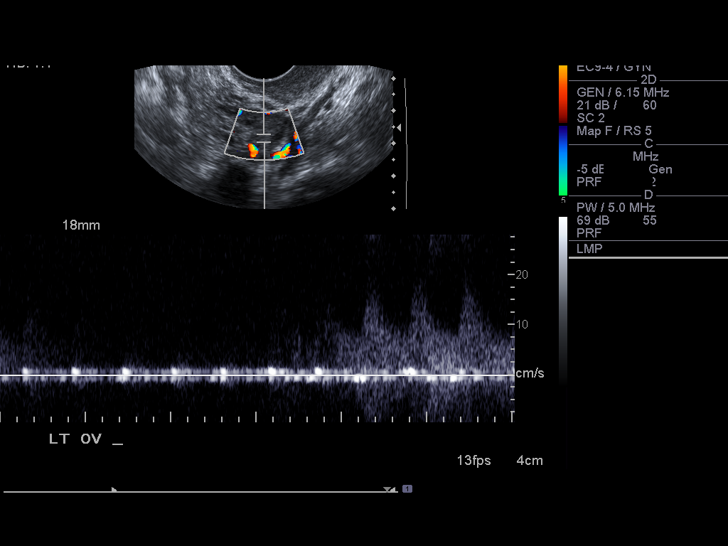
[im 34/46]
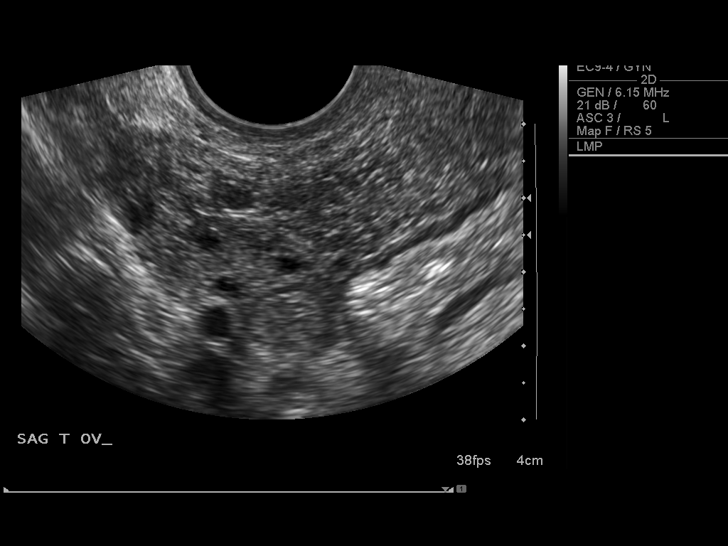
[im 38/46]
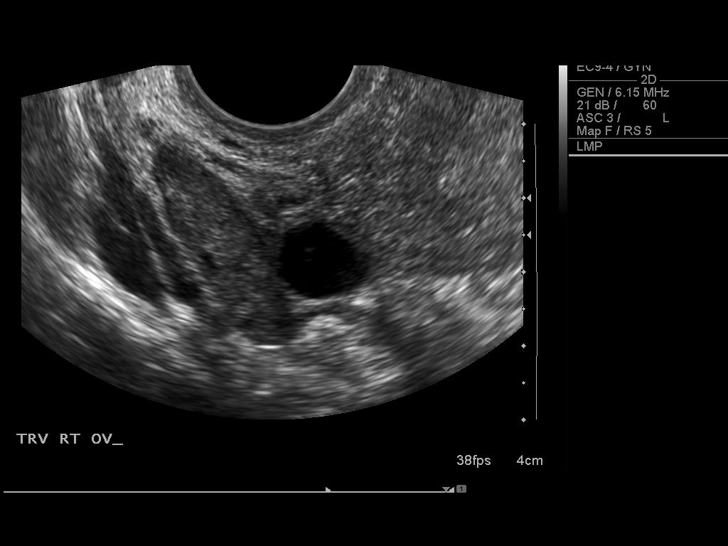
[im 42/46]
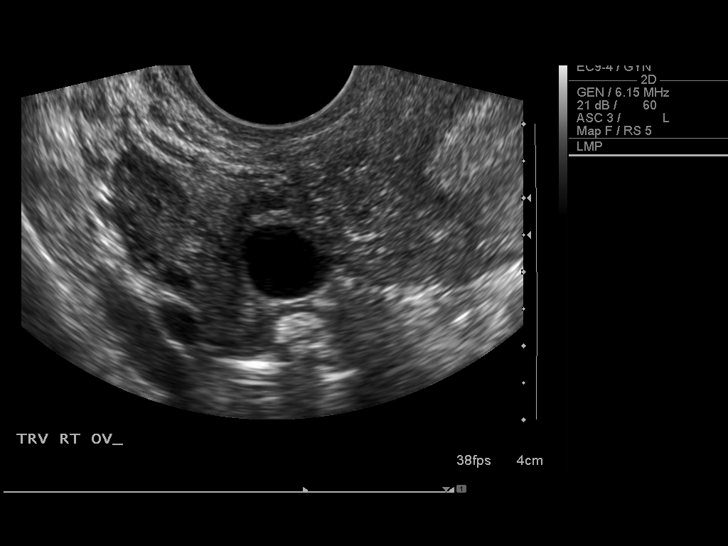
[im 46/46]
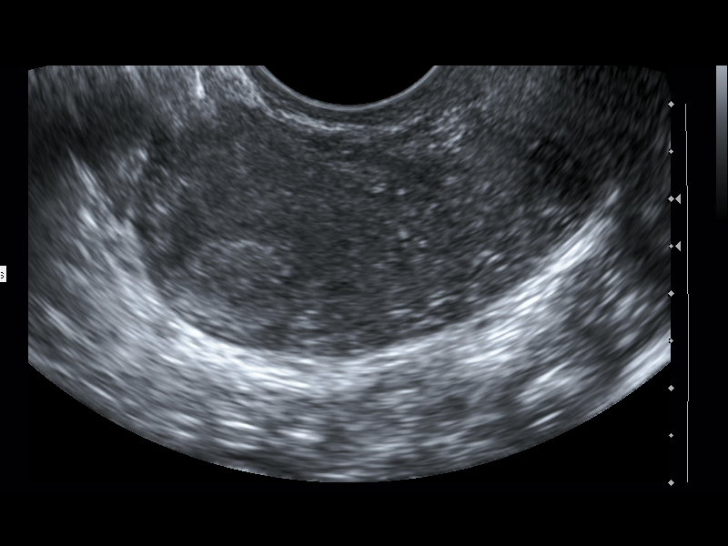

[13 of 25 positions shown; findings below may reference images not displayed]

It was necessary to proceed with endovaginal exam following the
transabdominal exam to visualize the ovaries and endometrium, as
the urinary bladder was incompletely distended and there was
abundant pelvic bowel gas on the transabdominal images.

Color and duplex Doppler ultrasound was utilized to evaluate blood
flow to the ovaries.
FINDINGS: Uterus:  Normal in size measuring approximately 5.0 x 3.4 x 5.0 cm.
Heterogeneous echotexture without focal myometrial mass.

Endometrium:  Normal in appearance measuring approximately 9 mm in
thickness, consistent with the secretory phase.  No endometrial
fluid or mass.

Right ovary: Normal in size and appearance containing small
follicular cysts, measuring approximately 2.4 x 1.8 x 2.7 cm.
Largest cyst measures approximately 1.2 cm.  No solid mass.  Normal
color Doppler signal.

Left ovary:   Normal in size and appearance containing small
follicular cysts, measuring approximately 2.2 x 1.7 x 1.6 cm.  No
dominant cyst or solid mass.  Normal color Doppler signal.

Pulsed Doppler evaluation demonstrates normal arterial and venous
waveforms in both ovaries.

Other findings:  No adnexal masses or free pelvic fluid.
IMPRESSION: Normal pelvic ultrasound for age.

No sonographic evidence for ovarian torsion.

## 2012-11-07 ENCOUNTER — Ambulatory Visit: Payer: BC Managed Care – PPO | Admitting: Internal Medicine

## 2012-11-14 ENCOUNTER — Ambulatory Visit: Payer: BC Managed Care – PPO | Admitting: Internal Medicine

## 2013-12-20 ENCOUNTER — Telehealth: Payer: Self-pay | Admitting: Internal Medicine

## 2013-12-20 NOTE — Telephone Encounter (Signed)
Dr Cato MulliganSwords is not accepting new patients.  She would need to establish with another physician

## 2013-12-20 NOTE — Telephone Encounter (Signed)
Pt not seen in over 4 yrs and would like to re-est w/ dr swords. pls advise

## 2013-12-25 NOTE — Telephone Encounter (Signed)
Please advise 

## 2013-12-25 NOTE — Telephone Encounter (Signed)
Pt mother,father ,aunt.etc see dr swords and pt would like to see dr swords

## 2013-12-27 ENCOUNTER — Ambulatory Visit (INDEPENDENT_AMBULATORY_CARE_PROVIDER_SITE_OTHER): Payer: BC Managed Care – PPO | Admitting: Family

## 2013-12-27 ENCOUNTER — Encounter: Payer: Self-pay | Admitting: Family

## 2013-12-27 VITALS — BP 114/60 | HR 103 | Ht <= 58 in | Wt 126.0 lb

## 2013-12-27 DIAGNOSIS — Z23 Encounter for immunization: Secondary | ICD-10-CM

## 2013-12-27 DIAGNOSIS — Z0000 Encounter for general adult medical examination without abnormal findings: Secondary | ICD-10-CM

## 2013-12-27 DIAGNOSIS — Z111 Encounter for screening for respiratory tuberculosis: Secondary | ICD-10-CM

## 2013-12-27 NOTE — Progress Notes (Signed)
Pre visit review using our clinic review tool, if applicable. No additional management support is needed unless otherwise documented below in the visit note. 

## 2013-12-27 NOTE — Progress Notes (Signed)
Subjective:    Patient ID: Kylie Olson, female    DOB: September 14, 1991, 23 y.o.   MRN: [id number]  HPI  23 year old, African American female, nonsmoker he is a for complete physical exam. 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 Sees gynecology for [facility].  Review of Systems  Constitutional: Negative.   HENT: Negative.   Eyes: Negative.   Respiratory: Negative.   Cardiovascular: Negative.   Gastrointestinal: Negative.   Endocrine: Negative.   Genitourinary: Negative.   Musculoskeletal: Negative.   Skin: Negative.   Allergic/Immunologic: Negative.   Neurological: Negative.   Hematological: Negative.   Psychiatric/Behavioral: Negative.    Past Medical History  Diagnosis Date  . Asthma     exercised induce    History   Social History  . Marital Status: Single    Spouse Name: N/A    Number of Children: N/A  . Years of Education: N/A   Occupational History  . Not on file.   Social History Main Topics  . Smoking status: Never Smoker   . Smokeless tobacco: Never Used  . Alcohol Use: No  . Drug Use: No  . Sexual Activity: No   Other Topics Concern  . Not on file   Social History Narrative  . No narrative on file    Past Surgical History  Procedure Laterality Date  . Refractive surgery      Family History  Problem Relation Age of Onset  . Asthma Maternal Grandmother   . Cancer Maternal Grandmother     PANCREATIC  . Hypertension Maternal Grandmother   . Hypertension Maternal Grandfather   . Heart disease Maternal Grandfather     quatrupal bypass   . Irritable bowel syndrome Mother   . Hypertension Mother   . Thyroid disease Maternal Uncle   . Hypertension Maternal Aunt   . Lupus Maternal Aunt   . Diabetes Maternal Aunt     No Known Allergies  Current Outpatient Prescriptions on File Prior to Visit  Medication Sig Dispense Refill  . Flaxseed, Linseed, (FLAXSEED OIL PO) Take by mouth.      [name] ibuprofen (ADVIL,MOTRIN) 600 MG  tablet Take 1 tablet (600 mg total) by mouth every 6 (six) hours as needed for pain.  30 tablet  0  . Multiple Vitamin (MULTIVITAMIN) tablet Take 1 tablet by mouth daily.       No current facility-administered medications on file prior to visit.    BP 114/60  Pulse 103  Ht 4\' 10"  (1.473 m)  Wt 126 lb (57.153 kg)  BMI 26.34 kg/m2  LMP 02/09/2015chart     Objective:   Physical Exam  Constitutional: She is oriented to person, place, and time. She appears well-developed and well-nourished.  HENT:  Head: Normocephalic.  Right Ear: External ear normal.  Left Ear: External ear normal.  Nose: Nose normal.  Mouth/Throat: Oropharynx is clear and moist.  Eyes: Conjunctivae are normal. Pupils are equal, round, and reactive to light.  Neck: Normal range of motion. Neck supple.  Cardiovascular: Normal rate, regular rhythm and normal heart sounds.   Pulmonary/Chest: Effort normal and breath sounds normal.  Abdominal: Soft. Bowel sounds are normal.  Musculoskeletal: Normal range of motion.  Neurological: She is alert and oriented to person, place, and time.  Skin: Skin is warm and dry.  Psychiatric: She has a normal mood and affect.          Assessment & Plan:  Shalunda was seen today for establish  care and annual exam.  Diagnoses and associated orders for this visit:  Preventative health care - Varicella Zoster Antibody, IgG - Measles/Mumps/Rubella Immunity   Call the office with any questions or concerns. Advised monthly self breast exams, safe sex practices.

## 2013-12-27 NOTE — Telephone Encounter (Signed)
Pt needed a form for her employer and has an appt w/ padonda today to get that filled out.

## 2013-12-28 LAB — VARICELLA ZOSTER ANTIBODY, IGG: VARICELLA IGG: 2190 {index} — AB (ref <135.00)

## 2013-12-28 LAB — MEASLES/MUMPS/RUBELLA IMMUNITY
MUMPS IGG: 176 [AU]/ml — AB (ref <9.00)
RUBELLA: 13.3 {index} — AB (ref <0.90)
RUBEOLA IGG: 134 [AU]/ml — AB (ref <25.00)

## 2013-12-29 LAB — TB SKIN TEST
Induration: 0 mm
TB SKIN TEST: NEGATIVE

## 2014-07-04 ENCOUNTER — Ambulatory Visit (INDEPENDENT_AMBULATORY_CARE_PROVIDER_SITE_OTHER): Payer: BC Managed Care – PPO | Admitting: Physician Assistant

## 2014-07-04 ENCOUNTER — Encounter: Payer: Self-pay | Admitting: Physician Assistant

## 2014-07-04 VITALS — BP 110/74 | HR 84 | Temp 98.6°F | Resp 18 | Wt 114.4 lb

## 2014-07-04 DIAGNOSIS — J069 Acute upper respiratory infection, unspecified: Secondary | ICD-10-CM

## 2014-07-04 MED ORDER — HYDROCODONE-HOMATROPINE 5-1.5 MG/5ML PO SYRP: 5.0000 mL | ORAL_SOLUTION | Freq: Three times a day (TID) | ORAL | Status: DC | PRN

## 2014-07-04 NOTE — Progress Notes (Signed)
Subjective:    Patient ID: Kylie Olson, female    DOB: 09/11/1991, 23 y.o.   MRN: [id number]  Cough This is a new problem. The current episode started in the past 7 days (6 days). The problem has been gradually worsening. The problem occurs every few minutes. The cough is non-productive. Associated symptoms include chills, ear pain (resolved, right sided), headaches, myalgias, nasal congestion, postnasal drip and a sore throat. Pertinent negatives include no chest pain, ear congestion, fever, heartburn, hemoptysis, rash, rhinorrhea, shortness of breath, sweats, weight loss or wheezing. Nothing aggravates the symptoms. 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 Treatments tried: theraflu, tylenol cold and sinus. The treatment provided no relief. Her past medical history is significant for asthma (excercise induced asthma) and environmental allergies. There is no history of COPD.      Review of Systems  Constitutional: Positive for chills. Negative for fever and weight loss.  HENT: Positive for congestion, ear pain (resolved, right sided), postnasal drip, sinus pressure and sore throat. Negative for rhinorrhea.   Respiratory: Positive for cough. Negative for hemoptysis, shortness of breath and wheezing.   Cardiovascular: Negative for chest pain.  Gastrointestinal: Negative for heartburn, nausea, vomiting and diarrhea.  Musculoskeletal: Positive for myalgias.  Skin: Negative for rash.  Allergic/Immunologic: Positive for environmental allergies.  Neurological: Positive for headaches. Negative for syncope.  All other systems reviewed and are negative.   Past Medical History  Diagnosis Date  . Asthma     exercised induce    History   Social History  . Marital Status: Single    Spouse Name: N/A    Number of Children: N/A  . Years of Education: N/A   Occupational History  . Not on file.   Social History Main Topics    . Smoking status: Never Smoker   . Smokeless tobacco: Never Used  . Alcohol Use: No  . Drug Use: No  . Sexual Activity: No   Other Topics Concern  . Not on file   Social History Narrative  . No narrative on file    Past Surgical History  Procedure Laterality Date  . Refractive surgery      Family History  Problem Relation Age of Onset  . Asthma Maternal Grandmother   . Cancer Maternal Grandmother     PANCREATIC  . Hypertension Maternal Grandmother   . Hypertension Maternal Grandfather   . Heart disease Maternal Grandfather     quatrupal bypass   . Irritable bowel syndrome Mother   . Hypertension Mother   . Thyroid disease Maternal Uncle   . Hypertension Maternal Aunt   . Lupus Maternal Aunt   . Diabetes Maternal Aunt     No Known Allergies  Current Outpatient Prescriptions on File Prior to Visit  Medication Sig Dispense Refill  . Flaxseed, Linseed, (FLAXSEED OIL PO) Take by mouth.      [name] ibuprofen (ADVIL,MOTRIN) 600 MG tablet Take 1 tablet (600 mg total) by mouth every 6 (six) hours as needed for pain.  30 tablet  0  . Multiple Vitamin (MULTIVITAMIN) tablet Take 1 tablet by mouth daily.       No current facility-administered medications on file prior to visit.    EXAM: BP 110/74  Pulse 84  Temp(Src) 98.6 F (37 C) (Oral)  Resp 18  Wt 114 lb 6.4 oz (51.891 kg)  SpO2 97%  LMP 07/02/2014     Objective:   Physical Exam  Nursing note and vitals reviewed. Constitutional: She is oriented to person, place, and time. She appears well-developed and well-nourished. No distress.  HENT:  Head: Normocephalic and atraumatic.  Right Ear: External ear normal.  Left Ear: External ear normal.  Nose: Nose normal.  Mouth/Throat: No oropharyngeal exudate.  Oropharynx is slightly erythematous, no exudate. Bilateral TMs normal. Bilateral frontal and maxillary sinuses non-TTP.  Eyes: Conjunctivae and EOM are normal.  Neck: Normal range of motion. Neck supple.   Cardiovascular: Normal rate, regular rhythm and intact distal pulses.   Pulmonary/Chest: Effort normal and breath sounds normal. No stridor. No respiratory distress. She has no wheezes. She has no rales. She exhibits no tenderness.  Lymphadenopathy:    She has no cervical adenopathy.  Neurological: She is alert and oriented to person, place, and time.  Skin: Skin is warm and dry. She is not diaphoretic. No pallor.  Psychiatric: She has a normal mood and affect. Her behavior is normal. Judgment and thought content normal.     Lab Results  Component Value Date   WBC 12.5* 07/31/2012   HGB 12.0 07/31/2012   HCT 34.8* 07/31/2012   PLT 241 07/31/2012   GLUCOSE 101* 07/31/2012   CHOL 159 07/19/2008   TRIG 41 07/19/2008   HDL 70 07/19/2008   LDLCALC 81 07/19/2008   ALT 15 07/31/2012   AST 25 07/31/2012   NA 137 07/31/2012   K 3.8 07/31/2012   CL 103 07/31/2012   CREATININE 0.50 07/31/2012   BUN 10 07/31/2012   CO2 20 07/31/2012   INR 1.0 07/18/2007        Assessment & Plan:  Loghan was seen today for cough.  Diagnoses and associated orders for this visit:  Acute upper respiratory infections of unspecified site Comments: Symptomatic treatment with Hycodan, OTC Mucinex, nasal steroid, antihistamine, rest, push fluids, watchful waiting. - HYDROcodone-homatropine (HYCODAN) 5-1.5 MG/5ML syrup; Take 5 mLs by mouth every 8 (eight) hours as needed for cough (No driving while taking this medication as it will inhibit your ability to operate a motor vehicle.).   Return precautions provided, and patient handout on URI.  Plan to follow up as needed, or for worsening or persistent symptoms despite treatment.  Patient Instructions  Hycodan cough syrup at night as needed for cough. No driving while taking this medication as it will inhibit your ability to operate a motor vehicle.  Plain Over the Counter Mucinex (NOT Mucinex D) for thick secretions  Force NON dairy fluids, drinking plenty of water is  best.    Over the Counter Flonase OR Nasacort AQ 1 spray in each nostril twice a day as needed. Use the "crossover" technique into opposite nostril spraying toward opposite ear @ 45 degree angle, not straight up into nostril.   Plain Over the Counter Allegra (NOT D )  160 daily , OR Loratidine 10 mg , OR Zyrtec 10 mg @ bedtime  as needed for itchy eyes & sneezing.  Saline Irrigation and Saline Sprays can also help reduce symptoms.  If emergency symptoms discussed during visit developed, seek medical attention immediately.  Followup as needed, or for worsening or persistent symptoms despite treatment.

## 2014-07-04 NOTE — Progress Notes (Signed)
Pre visit review using our clinic review tool, if applicable. No additional management support is needed unless otherwise documented below in the visit note. 

## 2014-07-04 NOTE — Patient Instructions (Addendum)
Hycodan cough syrup at night as needed for cough. No driving while taking this medication as it will inhibit your ability to operate a motor vehicle.  Plain Over the Counter Mucinex (NOT Mucinex D) for thick secretions  Force NON dairy fluids, drinking plenty of water is best.    Over the Counter Flonase OR Nasacort AQ 1 spray in each nostril twice a day as needed. Use the "crossover" technique into opposite nostril spraying toward opposite ear @ 45 degree angle, not straight up into nostril.   Plain Over the Counter Allegra (NOT D )  160 daily , OR Loratidine 10 mg , OR Zyrtec 10 mg @ bedtime  as needed for itchy eyes & sneezing.  Saline Irrigation and Saline Sprays can also help reduce symptoms.  If emergency symptoms discussed during visit developed, seek medical attention immediately.  Followup as needed, or for worsening or persistent symptoms despite treatment.     Upper Respiratory Infection, Adult An upper respiratory infection (URI) is also known as the common cold. It is often caused by a type of germ (virus). Colds are easily spread (contagious). You can pass it to others by kissing, coughing, sneezing, or drinking out of the same glass. Usually, you get better in 1 or 2 weeks.  HOME CARE   Only take medicine as told by your doctor.  Use a warm mist humidifier or breathe in steam from a hot shower.  Drink enough water and fluids to keep your pee (urine) clear or pale yellow.  Get plenty of rest.  Return to work when your temperature is back to normal or as told by your doctor. You may use a face mask and wash your hands to stop your cold from spreading. GET HELP RIGHT AWAY IF:   After the first few days, you feel you are getting worse.  You have questions about your medicine.  You have chills, shortness of breath, or brown or red spit (mucus).  You have yellow or brown snot (nasal discharge) or pain in the face, especially when you bend forward.  You have a fever,  puffy (swollen) neck, pain when you swallow, or white spots in the back of your throat.  You have a bad headache, ear pain, sinus pain, or chest pain.  You have a high-pitched whistling sound when you breathe in and out (wheezing).  You have a lasting cough or cough up blood.  You have sore muscles or a stiff neck. MAKE SURE YOU:   Understand these instructions.  Will watch your condition.  Will get help right away if you are not doing well or get worse. Document Released: 03/30/2008 Document Revised: 01/04/2012 Document Reviewed: 01/17/2014 [facility] Patient Information 2015 [place], [place]. This information is not intended to replace advice given to you by your health care provider. Make sure you discuss any questions you have with your health care provider.

## 2014-07-24 ENCOUNTER — Encounter: Payer: Self-pay | Admitting: Family Medicine

## 2014-07-24 ENCOUNTER — Ambulatory Visit (INDEPENDENT_AMBULATORY_CARE_PROVIDER_SITE_OTHER): Payer: BC Managed Care – PPO | Admitting: Family Medicine

## 2014-07-24 VITALS — BP 94/60 | HR 92 | Temp 98.4°F | Ht <= 58 in | Wt 113.0 lb

## 2014-07-24 DIAGNOSIS — H6983 Other specified disorders of Eustachian tube, bilateral: Secondary | ICD-10-CM

## 2014-07-24 DIAGNOSIS — G44209 Tension-type headache, unspecified, not intractable: Secondary | ICD-10-CM

## 2014-07-24 DIAGNOSIS — R519 Headache, unspecified: Secondary | ICD-10-CM

## 2014-07-24 DIAGNOSIS — H6990 Unspecified Eustachian tube disorder, unspecified ear: Secondary | ICD-10-CM

## 2014-07-24 DIAGNOSIS — R51 Headache: Secondary | ICD-10-CM

## 2014-07-24 DIAGNOSIS — H6993 Unspecified Eustachian tube disorder, bilateral: Secondary | ICD-10-CM

## 2014-07-24 DIAGNOSIS — J0110 Acute frontal sinusitis, unspecified: Secondary | ICD-10-CM

## 2014-07-24 DIAGNOSIS — H6980 Other specified disorders of Eustachian tube, unspecified ear: Secondary | ICD-10-CM

## 2014-07-24 DIAGNOSIS — J0111 Acute recurrent frontal sinusitis: Secondary | ICD-10-CM

## 2014-07-24 MED ORDER — SUMATRIPTAN SUCCINATE 50 MG PO TABS: 50.0000 mg | ORAL_TABLET | ORAL | Status: DC | PRN

## 2014-07-24 MED ORDER — AMOXICILLIN-POT CLAVULANATE 875-125 MG PO TABS: 1.0000 | ORAL_TABLET | Freq: Two times a day (BID) | ORAL | Status: DC

## 2014-07-24 NOTE — Patient Instructions (Addendum)
-  headache journal  -trial of headache medication only on days where at work (imitrex) if headache occurs  -doxycycline (antibiotic) for sinus issues  -nasocort daily for 4 weeks for ears  -follow up with Padonda in 3-4 weeks or sooner if worsening or new symptoms

## 2014-07-24 NOTE — Progress Notes (Signed)
Pre visit review using our clinic review tool, if applicable. No additional management support is needed unless otherwise documented below in the visit note. 

## 2014-07-24 NOTE — Progress Notes (Signed)
No chief complaint on file.   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  Past Medical History  Diagnosis Date  . Asthma     exercised induce    Past Surgical History  Procedure Laterality Date  . Refractive surgery      Family History  Problem Relation Age of Onset  . Asthma Maternal Grandmother   . Cancer Maternal Grandmother     PANCREATIC  . Hypertension Maternal Grandmother   . Hypertension Maternal Grandfather   . Heart disease Maternal Grandfather     quatrupal bypass   . Irritable bowel syndrome Mother   . Hypertension Mother   . Thyroid disease Maternal Uncle   . Hypertension Maternal Aunt   . Lupus Maternal Aunt   . Diabetes Maternal Aunt     History   Social History  . Marital Status: Single    Spouse Name: N/A    Number of Children: N/A  . Years of Education: N/A   Social History Main Topics  . Smoking status: Never Smoker   . Smokeless tobacco: Never Used  . Alcohol Use: No  . Drug Use: No  . Sexual Activity: No   Other Topics Concern  . None   Social History Narrative  . None    Current outpatient prescriptions:Acetaminophen (TYLENOL PO), Take by  mouth as needed., Disp: , Rfl: ;  Flaxseed, Linseed, (FLAXSEED OIL PO), Take by mouth., Disp: , Rfl: ;  amoxicillin-clavulanate (AUGMENTIN) 875-125 MG per tablet, Take 1 tablet by mouth 2 (two) times daily., Disp: 20 tablet, Rfl: 0 SUMAtriptan (IMITREX) 50 MG tablet, Take 1 tablet (50 mg total) by mouth every 2 (two) hours as needed for migraine or headache. May repeat in 2 hours if headache persists or recurs., Disp: 10 tablet, Rfl: 0  EXAM:  Filed Vitals:   07/24/14 1618  BP: 94/60  Pulse: 92  Temp: 98.4 F (36.9 C)    Body mass index is 23.62 kg/(m^2).  GENERAL: vitals reviewed and listed above, alert, oriented, appears well hydrated and in no acute distress  HEENT: atraumatic, conjunttiva clear, PERRLA, no obvious abnormalities on inspection of external nose and ears, normal appearance of ear canals and TMs except for clear effusion bilat, thick white nasal congestion, mild post oropharyngeal erythema with PND, no tonsillar edema or exudate, no sinus TTP, no temp art ttp  NECK: no obvious masses on inspection, no meningeal sign, tension and mild TTP sub occipital muscles  LUNGS: clear to auscultation bilaterally, no wheezes, rales or rhonchi, good air movement  CV: HRRR, no peripheral edema  MS: moves all extremities without noticeable abnormality  PSYCH: pleasant and cooperative, no obvious depression or anxiety  NEURO CN II-XII grossly intact, finger to nose normal  ASSESSMENT AND PLAN:  Discussed the following assessment and plan:  Frequent headaches - Plan: SUMAtriptan (IMITREX) 50 MG tablet Muscle tension headache -we discussed possible serious and likely etiologies, workup and treatment, treatment risks and return precautions- tension headaches most likely - headaches resolve between episodes and respond to tylenol, normal neuro exam -after this discussion, Savayah opted for HA journal, trial imitrex if gets headache on work day, stretching, stress management, eye exam  and treat sinusitis -follow up advised with Padonda in 3-4 weeks, may need to do imaging if persist -of course, we advised Aishwarya  to return or notify a doctor immediately if symptoms worsen or persist or new concerns arise.  Eustachian tube dysfunction, bilateral, nasocort, tx sinusitis  Acute recurrent frontal sinusitis - Plan: amoxicillin-clavulanate (AUGMENTIN) 875-125 MG per tablet  -Patient advised to return or notify a doctor immediately if symptoms worsen or persist or new concerns arise.  Patient Instructions  -headache journal  -trial of headache medication only on days where at work (imitrex) if headache occurs  -doxycycline (antibiotic) for sinus issues  -nasocort daily for 4 weeks for ears  -follow up with Padonda in 3-4 weeks or sooner if worsening or new symptpoms     KIM, HANNAH R.

## 2014-08-22 ENCOUNTER — Ambulatory Visit: Payer: BC Managed Care – PPO | Admitting: Family

## 2014-08-24 ENCOUNTER — Ambulatory Visit (INDEPENDENT_AMBULATORY_CARE_PROVIDER_SITE_OTHER): Payer: BC Managed Care – PPO | Admitting: Family

## 2014-08-24 ENCOUNTER — Encounter: Payer: Self-pay | Admitting: Family

## 2014-08-24 VITALS — BP 108/78 | HR 95 | Wt 112.6 lb

## 2014-08-24 DIAGNOSIS — K219 Gastro-esophageal reflux disease without esophagitis: Secondary | ICD-10-CM

## 2014-08-24 DIAGNOSIS — K589 Irritable bowel syndrome without diarrhea: Secondary | ICD-10-CM

## 2014-08-24 DIAGNOSIS — G43809 Other migraine, not intractable, without status migrainosus: Secondary | ICD-10-CM

## 2014-08-24 MED ORDER — OMEPRAZOLE 40 MG PO CPDR: 40.0000 mg | DELAYED_RELEASE_CAPSULE | Freq: Every day | ORAL | Status: DC

## 2014-08-24 NOTE — Progress Notes (Signed)
Pre visit review using our clinic review tool, if applicable. No additional management support is needed unless otherwise documented below in the visit note. 

## 2014-08-24 NOTE — Patient Instructions (Addendum)
1. Consider a Probiotic once daily.   Food Choices for Gastroesophageal Reflux Disease When you have gastroesophageal reflux disease (GERD), the foods you eat and your eating habits are very important. Choosing the right foods can help ease the discomfort of GERD. WHAT GENERAL GUIDELINES DO I NEED TO FOLLOW?  Choose fruits, vegetables, whole grains, low-fat dairy products, and low-fat meat, fish, and poultry.  Limit fats such as oils, salad dressings, butter, nuts, and avocado.  Keep a food diary to identify foods that cause symptoms.  Avoid foods that cause reflux. These may be different for different people.  Eat frequent small meals instead of three large meals each day.  Eat your meals slowly, in a relaxed setting.  Limit fried foods.  Cook foods using methods other than frying.  Avoid drinking alcohol.  Avoid drinking large amounts of liquids with your meals.  Avoid bending over or lying down until 2-3 hours after eating. WHAT FOODS ARE NOT RECOMMENDED? The following are some foods and drinks that may worsen your symptoms: Vegetables Tomatoes. Tomato juice. Tomato and spaghetti sauce. Chili peppers. Onion and garlic. Horseradish. Fruits Oranges, grapefruit, and lemon (fruit and juice). Meats High-fat meats, fish, and poultry. This includes hot dogs, ribs, ham, sausage, salami, and bacon. Dairy Whole milk and chocolate milk. Sour cream. Cream. Butter. Ice cream. Cream cheese.  Beverages Coffee and tea, with or without caffeine. Carbonated beverages or energy drinks. Condiments Hot sauce. Barbecue sauce.  Sweets/Desserts Chocolate and cocoa. Donuts. Peppermint and spearmint. Fats and Oils High-fat foods, including [place] fries and potato chips. Other Vinegar. Strong spices, such as black pepper, white pepper, red pepper, cayenne, curry powder, cloves, ginger, and chili powder. The items listed above may not be a complete list of foods and beverages to avoid. Contact  your dietitian for more information. Document Released: 10/12/2005 Document Revised: 10/17/2013 Document Reviewed: 08/16/2013 [facility] Patient Information 2015 [place], [place]. This information is not intended to replace advice given to you by your health care provider. Make sure you discuss any questions you have with your health care provider.

## 2014-08-27 ENCOUNTER — Encounter: Payer: Self-pay | Admitting: Family

## 2014-08-27 NOTE — Progress Notes (Signed)
Subjective:    Patient ID: Kylie Olson, female    DOB: [birth year], 23 y.o.   MRN: [id number]  HPI 23-old African-American female, nonsmoker is in today for recheck of headaches. She was seen by Dr. Selena BattenKim who suggested she take Imitrex as needed for migraine headaches. The medication as well. Continues to have abdominal cramping, frequent normal stools and burning in the epigastric area. She is not currently taking any medication. Has a known history of irritable bowel syndrome. His been taken Mylanta that has not helped much.   Review of Systems  Constitutional: Negative.   Respiratory: Negative.   Cardiovascular: Negative.   Gastrointestinal: Positive for abdominal pain. Negative for diarrhea and constipation.  Genitourinary: Negative.   Musculoskeletal: Negative.   Skin: Negative.   Allergic/Immunologic: Negative.   Neurological: Positive for headaches.  Hematological: Negative.   Psychiatric/Behavioral: Negative.    Past Medical History  Diagnosis Date  . Asthma     exercised induce    History   Social History  . Marital Status: Single    Spouse Name: N/A    Number of Children: N/A  . Years of Education: N/A   Occupational History  . Not on file.   Social History Main Topics  . Smoking status: Never Smoker   . Smokeless tobacco: Never Used  . Alcohol Use: No  . Drug Use: No  . Sexual Activity: No   Other Topics Concern  . Not on file   Social History Narrative    Past Surgical History  Procedure Laterality Date  . Refractive surgery      Family History  Problem Relation Age of Onset  . Asthma Maternal Grandmother   . Cancer Maternal Grandmother     PANCREATIC  . Hypertension Maternal Grandmother   . Hypertension Maternal Grandfather   . Heart disease Maternal Grandfather     quatrupal bypass   . Irritable bowel syndrome Mother   . Hypertension Mother   . Thyroid disease Maternal Uncle   . Hypertension Maternal Aunt   . Lupus Maternal Aunt     . Diabetes Maternal Aunt     No Known Allergies  Current Outpatient Prescriptions on File Prior to Visit  Medication Sig Dispense Refill  . Acetaminophen (TYLENOL PO) Take by mouth as needed.    . Flaxseed, Linseed, (FLAXSEED OIL PO) Take by mouth.    . SUMAtriptan (IMITREX) 50 MG tablet Take 1 tablet (50 mg total) by mouth every 2 (two) hours as needed for migraine or headache. May repeat in 2 hours if headache persists or recurs. 10 tablet 0   No current facility-administered medications on file prior to visit.    BP 108/78 mmHg  Pulse 95  Wt 112 lb 9.6 oz (51.075 kg)chart    Objective:   Physical Exam  Constitutional: She is oriented to person, place, and time. She appears well-developed and well-nourished.  HENT:  Right Ear: External ear normal.  Left Ear: External ear normal.  Nose: Nose normal.  Mouth/Throat: Oropharynx is clear and moist.  Neck: Normal range of motion. Neck supple.  Cardiovascular: Normal rate, regular rhythm and normal heart sounds.   Pulmonary/Chest: Effort normal and breath sounds normal.  Abdominal: Soft. Bowel sounds are normal.  Musculoskeletal: Normal range of motion.  Neurological: She is alert and oriented to person, place, and time.  Skin: Skin is warm and dry.  Psychiatric: She has a normal mood and affect.          Assessment &  Plan:  Kylie Olson was seen today for follow-up.  Diagnoses and associated orders for this visit:  IBS (irritable bowel syndrome)  Gastroesophageal reflux disease without esophagitis  Other migraine without status migrainosus, not intractable  Other Orders - omeprazole (PRILOSEC) 40 MG capsule; Take 1 capsule (40 mg total) by mouth daily.    Call the office with any questions or concerns. Recheck in 4 weeks and sooner as needed.

## 2014-11-20 ENCOUNTER — Emergency Department (HOSPITAL_COMMUNITY)
Admission: EM | Admit: 2014-11-20 | Discharge: 2014-11-20 | Disposition: A | Discharge status: Home / Self Care | Payer: BC Managed Care – PPO | Attending: Emergency Medicine | Admitting: Emergency Medicine

## 2014-11-20 ENCOUNTER — Encounter (HOSPITAL_COMMUNITY): Payer: Self-pay | Admitting: Emergency Medicine

## 2014-11-20 DIAGNOSIS — Y92410 Unspecified street and highway as the place of occurrence of the external cause: Secondary | ICD-10-CM | POA: Insufficient documentation

## 2014-11-20 DIAGNOSIS — S199XXA Unspecified injury of neck, initial encounter: Secondary | ICD-10-CM | POA: Insufficient documentation

## 2014-11-20 DIAGNOSIS — J45909 Unspecified asthma, uncomplicated: Secondary | ICD-10-CM | POA: Diagnosis not present

## 2014-11-20 DIAGNOSIS — S46912A Strain of unspecified muscle, fascia and tendon at shoulder and upper arm level, left arm, initial encounter: Secondary | ICD-10-CM | POA: Insufficient documentation

## 2014-11-20 DIAGNOSIS — Y998 Other external cause status: Secondary | ICD-10-CM | POA: Insufficient documentation

## 2014-11-20 DIAGNOSIS — Y9389 Activity, other specified: Secondary | ICD-10-CM | POA: Insufficient documentation

## 2014-11-20 DIAGNOSIS — S46812A Strain of other muscles, fascia and tendons at shoulder and upper arm level, left arm, initial encounter: Secondary | ICD-10-CM

## 2014-11-20 DIAGNOSIS — S3992XA Unspecified injury of lower back, initial encounter: Secondary | ICD-10-CM | POA: Insufficient documentation

## 2014-11-20 DIAGNOSIS — Z79899 Other long term (current) drug therapy: Secondary | ICD-10-CM | POA: Diagnosis not present

## 2014-11-20 DIAGNOSIS — S4992XA Unspecified injury of left shoulder and upper arm, initial encounter: Secondary | ICD-10-CM | POA: Diagnosis present

## 2014-11-20 MED ORDER — CYCLOBENZAPRINE HCL 5 MG PO TABS: 5.0000 mg | ORAL_TABLET | Freq: Two times a day (BID) | ORAL | Status: DC | PRN

## 2014-11-20 MED ORDER — HYDROCODONE-ACETAMINOPHEN 5-325 MG PO TABS: 1.0000 | ORAL_TABLET | ORAL | Status: DC | PRN

## 2014-11-20 MED ORDER — NAPROXEN 500 MG PO TABS: 500.0000 mg | ORAL_TABLET | Freq: Two times a day (BID) | ORAL | Status: DC
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
## 2014-11-23 ENCOUNTER — Encounter: Payer: Self-pay | Admitting: Family Medicine

## 2014-11-23 ENCOUNTER — Ambulatory Visit (INDEPENDENT_AMBULATORY_CARE_PROVIDER_SITE_OTHER): Payer: BC Managed Care – PPO | Admitting: Family Medicine

## 2014-11-23 VITALS — BP 106/70 | HR 79 | Temp 98.1°F | Wt 110.0 lb

## 2014-11-23 DIAGNOSIS — M546 Pain in thoracic spine: Secondary | ICD-10-CM

## 2014-11-23 DIAGNOSIS — M549 Dorsalgia, unspecified: Secondary | ICD-10-CM

## 2014-11-23 MED ORDER — METAXALONE 800 MG PO TABS: 800.0000 mg | ORAL_TABLET | Freq: Three times a day (TID) | ORAL | Status: DC
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
## 2014-11-23 NOTE — Progress Notes (Signed)
Pre visit review using our clinic review tool, if applicable. No additional management support is needed unless otherwise documented below in the visit note. 

## 2014-12-28 ENCOUNTER — Ambulatory Visit (INDEPENDENT_AMBULATORY_CARE_PROVIDER_SITE_OTHER): Payer: BC Managed Care – PPO | Admitting: Family Medicine

## 2014-12-28 ENCOUNTER — Encounter: Payer: Self-pay | Admitting: Family Medicine

## 2014-12-28 VITALS — BP 110/70 | HR 106 | Temp 98.3°F | Wt 109.0 lb

## 2014-12-28 DIAGNOSIS — M546 Pain in thoracic spine: Secondary | ICD-10-CM

## 2014-12-28 DIAGNOSIS — M545 Low back pain, unspecified: Secondary | ICD-10-CM

## 2014-12-28 MED ORDER — NAPROXEN 500 MG PO TABS: 500.0000 mg | ORAL_TABLET | Freq: Two times a day (BID) | ORAL | Status: DC

## 2014-12-28 NOTE — Progress Notes (Signed)
Pre visit review using our clinic review tool, if applicable. No additional management support is needed unless otherwise documented below in the visit note. 

## 2014-12-28 NOTE — Patient Instructions (Signed)
Take the Skelaxin and Naproxen every night before bedtime Consider back massage.
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
## 2015-01-09 ENCOUNTER — Emergency Department (HOSPITAL_COMMUNITY)
Admission: EM | Admit: 2015-01-09 | Discharge: 2015-01-09 | Disposition: A | Discharge status: Home / Self Care | Payer: Self-pay | Source: Home / Self Care

## 2015-06-04 LAB — HM PAP SMEAR: HM Pap smear: NEGATIVE

## 2015-07-04 ENCOUNTER — Telehealth: Payer: Self-pay | Admitting: Family

## 2015-07-04 ENCOUNTER — Ambulatory Visit (INDEPENDENT_AMBULATORY_CARE_PROVIDER_SITE_OTHER): Payer: BC Managed Care – PPO | Admitting: Internal Medicine

## 2015-07-04 ENCOUNTER — Encounter: Payer: Self-pay | Admitting: Internal Medicine

## 2015-07-04 VITALS — BP 110/60 | HR 84 | Temp 98.4°F | Ht <= 58 in | Wt 108.0 lb

## 2015-07-04 DIAGNOSIS — R519 Headache, unspecified: Secondary | ICD-10-CM

## 2015-07-04 DIAGNOSIS — R51 Headache: Secondary | ICD-10-CM

## 2015-07-04 DIAGNOSIS — G43001 Migraine without aura, not intractable, with status migrainosus: Secondary | ICD-10-CM

## 2015-07-04 DIAGNOSIS — G43909 Migraine, unspecified, not intractable, without status migrainosus: Secondary | ICD-10-CM | POA: Insufficient documentation

## 2015-07-04 MED ORDER — HYDROCODONE-ACETAMINOPHEN 5-325 MG PO TABS
1.0000 | ORAL_TABLET | ORAL | Status: DC | PRN
Start: 2015-07-04 — End: 2015-09-17

## 2015-07-04 MED ORDER — SUMATRIPTAN SUCCINATE 50 MG PO TABS
50.0000 mg | ORAL_TABLET | ORAL | Status: DC | PRN
Start: 2015-07-04 — End: 2015-09-17

## 2015-07-04 NOTE — Patient Instructions (Signed)

## 2015-07-04 NOTE — Progress Notes (Signed)
Subjective:    Patient ID: Kylie Olson, female    DOB: 03/29/1991, 24 y.o.   MRN: [id number]  HPI   24 year old patient who has a history of migraine headaches. She presents with a 2 day history of typical migraine headache.  She describes pain in the bilateral frontal area as well as posterior neck.  Pain is described as a squeezing sensation.  She has been using Tylenol and Excedrin without much benefit.  Yesterday she had some brief blurred vision and some dizziness.  She does have a prescription for Imitrex that she has not taken due to the fear of diarrhea.  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 No URI symptoms. Denies any fever  She is accompanied by her mother.\ Past Medical History  Diagnosis Date  . Asthma     exercised induce    Social History   Social History  . Marital Status: Single    Spouse Name: N/A  . Number of Children: N/A  . Years of Education: N/A   Occupational History  . Not on file.   Social History Main Topics  . Smoking status: Never Smoker   . Smokeless tobacco: Never Used  . Alcohol Use: No  . Drug Use: No  . Sexual Activity: No   Other Topics Concern  . Not on file   Social History Narrative    Past Surgical History  Procedure Laterality Date  . Refractive surgery      Family History  Problem Relation Age of Onset  . Asthma Maternal Grandmother   . Cancer Maternal Grandmother     PANCREATIC  . Hypertension Maternal Grandmother   . Hypertension Maternal Grandfather   . Heart disease Maternal Grandfather     quatrupal bypass   . Irritable bowel syndrome Mother   . Hypertension Mother   . Thyroid disease Maternal Uncle   . Hypertension Maternal Aunt   . Lupus Maternal Aunt   . Diabetes Maternal Aunt     No Known Allergies  Current Outpatient Prescriptions on File Prior to Visit  Medication Sig Dispense Refill  . omeprazole (PRILOSEC) 40 MG capsule Take 1  capsule (40 mg total) by mouth daily. 30 capsule 3  . Acetaminophen (TYLENOL PO) Take by mouth as needed.     No current facility-administered medications on file prior to visit.    BP 110/60 mmHg  Pulse 84  Temp(Src) 98.4 F (36.9 C) (Oral)  Ht 4\' 10"  (1.473 m)  Wt 108 lb (48.988 kg)  BMI 22.58 kg/m2  SpO2 98%      Review of Systems  Constitutional: Negative.   HENT: Negative for congestion, dental problem, hearing loss, rhinorrhea, sinus pressure, sore throat and tinnitus.   Eyes: Negative for pain, discharge and visual disturbance.  Respiratory: Negative for cough and shortness of breath.   Cardiovascular: Negative for chest pain, palpitations and leg swelling.  Gastrointestinal: Negative for nausea, vomiting, abdominal pain, diarrhea, constipation, blood in stool and abdominal distention.  Genitourinary: Negative for dysuria, urgency, frequency, hematuria, flank pain, vaginal bleeding, vaginal discharge, difficulty urinating, vaginal pain and pelvic pain.  Musculoskeletal: Negative for joint swelling, arthralgias and gait problem.  Skin: Negative for rash.  Neurological: Positive for headaches. Negative for dizziness, syncope, speech difficulty, weakness and numbness.  Hematological: Negative for adenopathy.  Psychiatric/Behavioral: Negative for behavioral problems, dysphoric mood and agitation. The patient is not nervous/anxious.  Objective:   Physical Exam  Constitutional: She is oriented to person, place, and time. She appears well-developed and well-nourished. No distress.  Appears uncomfortable but in no acute distress.  Alert, oriented, able to respond to all questions Afebrile  HENT:  Mouth/Throat: Oropharynx is clear and moist.  Eyes: Conjunctivae and EOM are normal.  Mild photosensitivity  Neck: Normal range of motion. Neck supple. No thyromegaly present.  Cardiovascular: Normal rate and regular rhythm.   Lymphadenopathy:    She has no cervical  adenopathy.  Neurological: She is alert and oriented to person, place, and time. No cranial nerve deficit. Coordination normal.          Assessment & Plan:   Migraine headache.  Patient was encouraged to give the Imitrex a trial.  She was given a prescription for Vicodin. An injection of Phenergan/Demerol declined.  She states that she has duties to attend to tonight.  In spite of her headache of 2 days' duration.  She has not missed school.  She declines a doctor's excuse to avoid school tomorrow. She was told to give Excedrin Migraine a trial.  If she wishes to avoid Imitrex

## 2015-07-04 NOTE — Telephone Encounter (Signed)
Patient Name: Kylie Olson DOB: [birth year] Initial Comment Caller states past 2 days has migraine headache to the point of blurry vision and ears ringing. has taken tylenol instead of RX meds d/t rx meds upsetting stomach; no vomiting but having loose stool. Nurse Assessment Nurse: Elijah Birk, RN, Lynda Date/Time (Eastern Time): 07/04/2015 10:43:52 AM Confirm and document reason for call. If symptomatic, describe symptoms. ---Caller states past 2 days has migraine headache to the point of blurry vision and ears ringing. Has taken Tylenol instead of RX meds d/t rx meds. upsetting stomach. No vomiting but having loose stool. Has the patient traveled out of the country within the last 30 days? ---Not Applicable Does the patient require triage? ---Yes Related visit to physician within the last 2 weeks? ---No Does the PT have any chronic conditions? (i.e. diabetes, asthma, etc.) ---Yes List chronic conditions. ---migranes Did the patient indicate they were pregnant? ---No Guidelines Guideline Title Affirmed Question Affirmed Notes Headache [1] SEVERE headache (e.g., excruciating) AND [2] "worst headache" of life Final Disposition User Go to ED Now (or PCP triage) Elijah Birk, RN, Lynda Comments Caller would like feedback about her current rx for migranes, whether that needs to be changed, or if she should be seen. 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 She can be reached at this #, or it is alright to leave a message. Referrals GO TO FACILITY UNDECIDED REFERRED TO PCP OFFICE Disagree/Comply: Disagree Disagree/Comply Reason: Disagree with instructions

## 2015-07-04 NOTE — Progress Notes (Signed)
Pre visit review using our clinic review tool, if applicable. No additional management support is needed unless otherwise documented below in the visit note. 

## 2015-07-04 NOTE — Telephone Encounter (Signed)
Left voicemail for patient to call back. 

## 2015-07-08 ENCOUNTER — Ambulatory Visit: Payer: Self-pay | Admitting: Internal Medicine

## 2015-07-18 NOTE — Telephone Encounter (Signed)
Patient seen.

## 2015-08-01 ENCOUNTER — Ambulatory Visit (INDEPENDENT_AMBULATORY_CARE_PROVIDER_SITE_OTHER): Payer: BC Managed Care – PPO | Admitting: Adult Health

## 2015-08-01 ENCOUNTER — Telehealth: Payer: Self-pay | Admitting: Family

## 2015-08-01 ENCOUNTER — Encounter: Payer: Self-pay | Admitting: Adult Health

## 2015-08-01 VITALS — BP 112/80 | Temp 98.4°F | Ht <= 58 in | Wt 108.8 lb

## 2015-08-01 DIAGNOSIS — R197 Diarrhea, unspecified: Secondary | ICD-10-CM

## 2015-08-01 DIAGNOSIS — G43001 Migraine without aura, not intractable, with status migrainosus: Secondary | ICD-10-CM

## 2015-08-01 MED ORDER — ONDANSETRON HCL 4 MG PO TABS: 4.0000 mg | ORAL_TABLET | Freq: Three times a day (TID) | ORAL | Status: DC | PRN

## 2015-08-01 MED ORDER — MEPERIDINE HCL 50 MG/ML IJ SOLN
50.0000 mg | Freq: Once | INTRAMUSCULAR | Status: AC
  Administered 2015-08-01: 50 mg via INTRAMUSCULAR

## 2015-08-01 MED ORDER — PROMETHAZINE HCL 50 MG/ML IJ SOLN
25.0000 mg | Freq: Four times a day (QID) | INTRAMUSCULAR | Status: DC | PRN
  Administered 2015-08-01: 25 mg via INTRAMUSCULAR

## 2015-08-01 NOTE — Telephone Encounter (Signed)
Patient Name: Kylie Olson DOB: [birth year] Initial Comment Caller States she has migraines, nausea, loose stool. she was given med for the migraines but does not seem to be helping. Nurse Assessment Nurse: Charna Elizabeth, RN, Cathy Date/Time (Eastern Time): 08/01/2015 11:15:35 AM Confirm and document reason for call. If symptomatic, describe symptoms. ---Caller states she developed another Migraine two days ago and diarrhea three days (3 episodes in the past 24 hours). No vomiting or fever. No injury in the past 3 days. Has the patient traveled out of the country within the last 30 days? ---No Does the patient have any new or worsening symptoms? ---Yes Will a triage be completed? ---Yes Related visit to physician within the last 2 weeks? ---No Does the PT have any chronic conditions? (i.e. diabetes, asthma, etc.) ---Yes List chronic conditions. ---Migraines Did the patient indicate they were pregnant? ---No Guidelines Guideline Title Affirmed Question Affirmed Notes Headache [1] MODERATE headache (e.g., interferes with normal activities) AND [2] present > 24 hours AND [3] unexplained (Exceptions: analgesics not tried, typical migraine, or headache part of viral illness) Diarrhea [1] MODERATE diarrhea (e.g., 4-6 times / day more than normal) AND [2] present > 48 hours (2 days) Final Disposition User See Physician within 24 Hours [place], RN, Lynden Ang Comments Scheduled 4pm appointment today with Evelene Croon, NP. Referrals REFERRED TO PCP OFFICE Disagree/Comply: Comply

## 2015-08-01 NOTE — Progress Notes (Signed)
Subjective:    Patient ID: Kylie Olson, female    DOB: 1991-04-18, 24 y.o.   MRN: [id number]  HPI  24 year old female who presents to the office today for migraines and diarrhea.   1) Migraine since Tuesday. She took Imitrex when she felt the migraine starting,which helped and she was able to go to sleep. When she woke up the migraine was back and she has not been able to relieve it since. She endorses that she is getting more frequent migraines. Twice a month for the last 3-4 months. She endorses photophobia and phonophobia.   2) Diarrhea since Monday. She has not had a normal BM since Monday. At the beginning of the week she had nausea as well, but has since resolved. Her appetite is not great but she is staying well hydrated. Denies any vomiting or abdominal pain. Has not traveled anywhere and has not taken any abx. She is an Tourist information centre manager.    Review of Systems  Constitutional: Positive for activity change, appetite change and fatigue. Negative for fever, chills and diaphoresis.  Eyes: Negative.   Respiratory: Negative.   Cardiovascular: Negative.   Gastrointestinal: Positive for nausea (resolved) and diarrhea. Negative for vomiting, abdominal pain, blood in stool, abdominal distention and anal bleeding.  Neurological: Positive for headaches. Negative for dizziness, speech difficulty, weakness and light-headedness.  Psychiatric/Behavioral: Positive for sleep disturbance.   Past Medical History  Diagnosis Date  . Asthma     exercised induce    Social History   Social History  . Marital Status: Single    Spouse Name: N/A  . Number of Children: N/A  . Years of Education: N/A   Occupational History  . Not on file.   Social History Main Topics  . Smoking status: Never Smoker   . Smokeless tobacco: Never Used  . Alcohol Use: No  . Drug Use: No  . Sexual Activity: No   Other Topics Concern  . Not on file   Social History Narrative    Past Surgical  History  Procedure Laterality Date  . Refractive surgery      Family History  Problem Relation Age of Onset  . Asthma Maternal Grandmother   . Cancer Maternal Grandmother     PANCREATIC  . Hypertension Maternal Grandmother   . Hypertension Maternal Grandfather   . Heart disease Maternal Grandfather     quatrupal bypass   . Irritable bowel syndrome Mother   . Hypertension Mother   . Thyroid disease Maternal Uncle   . Hypertension Maternal Aunt   . Lupus Maternal Aunt   . Diabetes Maternal Aunt     No Known Allergies  Current Outpatient Prescriptions on File Prior to Visit  Medication Sig Dispense Refill  . Acetaminophen (TYLENOL PO) Take by mouth as needed.    [name] HYDROcodone-acetaminophen (NORCO) 5-325 MG per tablet Take 1 tablet by mouth every 4 (four) hours as needed. 6 tablet 0  . SUMAtriptan (IMITREX) 50 MG tablet Take 1 tablet (50 mg total) by mouth every 2 (two) hours as needed for migraine or headache. May repeat in 2 hours if headache persists or recurs. 10 tablet 0  . omeprazole (PRILOSEC) 40 MG capsule Take 1 capsule (40 mg total) by mouth daily. (Patient not taking: Reported on 08/01/2015) 30 capsule 3   No current facility-administered medications on file prior to visit.    BP 112/80 mmHg  Temp(Src) 98.4 F (36.9 C) (Oral)  Ht  (1.473 m)  Wt 108 lb 12.8 oz (49.351 kg)  BMI 22.75 kg/m2  LMP 07/31/2015       Objective:   Physical Exam  Constitutional: She is oriented to person, place, and time. She appears well-developed and well-nourished. No distress.  Appears tired and worn down  Eyes: Conjunctivae and EOM are normal. Pupils are equal, round, and reactive to light. Right eye exhibits no discharge. Left eye exhibits no discharge. No scleral icterus.  Neck: Normal range of motion. Neck supple.  Cardiovascular: Normal rate, normal heart sounds and intact distal pulses.  Exam reveals no gallop and no friction rub.   No murmur heard. Pulmonary/Chest:  Effort normal and breath sounds normal. No respiratory distress. She has no wheezes. She has no rales. She exhibits no tenderness.  Abdominal: Soft. Bowel sounds are normal. She exhibits no distension and no mass. There is no tenderness. There is no rebound and no guarding.  Musculoskeletal: Normal range of motion.  Lymphadenopathy:    She has no cervical adenopathy.  Neurological: She is alert and oriented to person, place, and time.  Skin: Skin is warm and dry. No rash noted. She is not diaphoretic. No erythema. No pallor.  Psychiatric: She has a normal mood and affect. Her behavior is normal. Judgment and thought content normal.  Nursing note and vitals reviewed.      Assessment & Plan:   1. Migraine without aura and with status migrainosus, not intractable - ondansetron (ZOFRAN) 4 MG tablet; Take 1 tablet (4 mg total) by mouth every 8 (eight) hours as needed for nausea or vomiting.  Dispense: 20 tablet; Refill: 0 - Ambulatory referral to Neurology - promethazine (PHENERGAN) injection 25 mg; Inject 0.5 mLs (25 mg total) into the muscle every 6 (six) hours as needed for nausea or vomiting. - meperidine (DEMEROL) injection 50 mg; Inject 1 mL (50 mg total) into the muscle once.  2. Diarrhea, unspecified type - Imodium or Pepto as needed  - Stay hydrated - BRAT diet - Follow up in 2-3 days if no improvement.

## 2015-08-01 NOTE — Patient Instructions (Addendum)
It was great meeting you today!  I am sorry you are feeling so badly.   Continue with the imitrex as needed.  Someone will call you to schedule a neurology appointment.   Use Immodium or pepto to help with the diarrhea Stay hydrated and eat a BRAT diet   If no improvement in the next 2-3 days, please let me know.   Food Choices to Help Relieve Diarrhea, Adult When you have diarrhea, the foods you eat and your eating habits are very important. Choosing the right foods and drinks can help relieve diarrhea. Also, because diarrhea can last up to 7 days, you need to replace lost fluids and electrolytes (such as sodium, potassium, and chloride) in order to help prevent dehydration.  WHAT GENERAL GUIDELINES DO I NEED TO FOLLOW?  Slowly drink 1 cup (8 oz) of fluid for each episode of diarrhea. If you are getting enough fluid, your urine will be clear or pale yellow.  Eat starchy foods. Some good choices include white rice, white toast, pasta, low-fiber cereal, baked potatoes (without the skin), saltine crackers, and bagels.  Avoid large servings of any cooked vegetables.  Limit fruit to two servings per day. A serving is  cup or 1 small piece.  Choose foods with less than 2 g of fiber per serving.  Limit fats to less than 8 tsp (38 g) per day.  Avoid fried foods.  Eat foods that have probiotics in them. Probiotics can be found in certain dairy products.  Avoid foods and beverages that may increase the speed at which food moves through the stomach and intestines (gastrointestinal tract). Things to avoid include:  High-fiber foods, such as dried fruit, raw fruits and vegetables, nuts, seeds, and whole grain foods.  Spicy foods and high-fat foods.  Foods and beverages sweetened with high-fructose corn syrup, honey, or sugar alcohols such as xylitol, sorbitol, and mannitol. WHAT FOODS ARE RECOMMENDED? Grains White rice. White, [place], or pita breads (fresh or toasted), including plain  rolls, buns, or bagels. White pasta. Saltine, soda, or graham crackers. Pretzels. Low-fiber cereal. Cooked cereals made with water (such as cornmeal, farina, or cream cereals). Plain muffins. Matzo. Melba toast. Zwieback.  Vegetables Potatoes (without the skin). Strained tomato and vegetable juices. Most well-cooked and canned vegetables without seeds. Tender lettuce. Fruits Cooked or canned applesauce, apricots, cherries, fruit cocktail, grapefruit, peaches, pears, or plums. Fresh bananas, apples without skin, cherries, grapes, cantaloupe, grapefruit, peaches, oranges, or plums.  Meat and Other Protein Products Baked or boiled chicken. Eggs. Tofu. Fish. Seafood. Smooth peanut butter. Ground or well-cooked tender beef, ham, veal, lamb, pork, or poultry.  Dairy Plain yogurt, kefir, and unsweetened liquid yogurt. Lactose-free milk, buttermilk, or soy milk. Plain hard cheese. Beverages Sport drinks. Clear broths. Diluted fruit juices (except prune). Regular, caffeine-free sodas such as ginger ale. Water. Decaffeinated teas. Oral rehydration solutions. Sugar-free beverages not sweetened with sugar alcohols. Other Bouillon, broth, or soups made from recommended foods.  The items listed above may not be a complete list of recommended foods or beverages. Contact your dietitian for more options. WHAT FOODS ARE NOT RECOMMENDED? Grains Whole grain, whole wheat, bran, or rye breads, rolls, pastas, crackers, and cereals. Wild or brown rice. Cereals that contain more than 2 g of fiber per serving. Corn tortillas or taco shells. Cooked or dry oatmeal. Granola. Popcorn. Vegetables Raw vegetables. Cabbage, broccoli, Brussels sprouts, artichokes, baked beans, beet greens, corn, kale, legumes, peas, sweet potatoes, and yams. Potato skins. Cooked spinach and cabbage.  Fruits Dried fruit, including raisins and dates. Raw fruits. Stewed or dried prunes. Fresh apples with skin, apricots, mangoes, pears, raspberries,  and strawberries.  Meat and Other Protein Products Chunky peanut butter. Nuts and seeds. Beans and lentils. Tomasa Blase.  Dairy High-fat cheeses. Milk, chocolate milk, and beverages made with milk, such as milk shakes. Cream. Ice cream. Sweets and Desserts Sweet rolls, doughnuts, and sweet breads. Pancakes and waffles. Fats and Oils Butter. Cream sauces. Margarine. Salad oils. Plain salad dressings. Olives. Avocados.  Beverages Caffeinated beverages (such as coffee, tea, soda, or energy drinks). Alcoholic beverages. Fruit juices with pulp. Prune juice. Soft drinks sweetened with high-fructose corn syrup or sugar alcohols. Other Coconut. Hot sauce. Chili powder. Mayonnaise. Gravy. Cream-based or milk-based soups.  The items listed above may not be a complete list of foods and beverages to avoid. Contact your dietitian for more information. WHAT SHOULD I DO IF I BECOME DEHYDRATED? Diarrhea can sometimes lead to dehydration. Signs of dehydration include dark urine and dry mouth and skin. If you think you are dehydrated, you should rehydrate with an oral rehydration solution. These solutions can be purchased at pharmacies, retail stores, or online.  Drink -1 cup (120-240 mL) of oral rehydration solution each time you have an episode of diarrhea. If drinking this amount makes your diarrhea worse, try drinking smaller amounts more often. For example, drink 1-3 tsp (5-15 mL) every 5-10 minutes.  A general rule for staying hydrated is to drink 1-2 L of fluid per day. Talk to your health care provider about the specific amount you should be drinking each day. Drink enough fluids to keep your urine clear or pale yellow.   This information is not intended to replace advice given to you by your health care provider. Make sure you discuss any questions you have with your health care provider.   Document Released: 01/02/2004 Document Revised: 11/02/2014 Document Reviewed: 09/04/2013 Elsevier Interactive Patient  Education [employer]

## 2015-08-01 NOTE — Progress Notes (Signed)
Pre visit review using our clinic review tool, if applicable. No additional management support is needed unless otherwise documented below in the visit note. 

## 2015-08-07 ENCOUNTER — Other Ambulatory Visit: Payer: Self-pay | Admitting: Adult Health

## 2015-08-07 ENCOUNTER — Telehealth: Payer: Self-pay | Admitting: Family

## 2015-08-07 MED ORDER — RIZATRIPTAN BENZOATE 5 MG PO TABS: 5.0000 mg | ORAL_TABLET | ORAL | Status: DC | PRN

## 2015-08-07 NOTE — Telephone Encounter (Signed)
Patient Name: Kylie Olson  DOB: February 08, 1991    Initial Comment caller states she has a migraine   Nurse Assessment  Nurse: Stefano GaulStringer, RN, Dwana CurdVera Date/Time (Eastern Time): 08/07/2015 9:29:11 AM  Confirm and document reason for call. If symptomatic, describe symptoms. ---Caller states she has a migraine. She was seen at the doctor last week for migraines. She was given a injection. She has appt with neurologist until the end of November. The medication for her migraines upset her stomach and the medication for her stomach makes her tired and "loopy". She has a migraine right now. She is at work and has not taken the medication as it makes her tired and can not work after she tkes the medication.  Has the patient traveled out of the country within the last 30 days? ---Not Applicable  Does the patient have any new or worsening symptoms? ---Yes  Will a triage be completed? ---Yes  Related visit to physician within the last 2 weeks? ---Yes  Does the PT have any chronic conditions? (i.e. diabetes, asthma, etc.) ---Yes  List chronic conditions. ---migraines  Did the patient indicate they were pregnant? ---No     Guidelines    Guideline Title Affirmed Question Affirmed Notes  Headache [1] SEVERE headache (e.g., excruciating) AND [2] "worst headache" of life    Final Disposition User   Go to ED Now (or PCP triage) Stefano GaulStringer, RN, Vera    Comments  Called back line at office and spoke to AlexandriaSandrea regarding pt's severe migraine and her migraine medication giving her an upset stomach and the medication for her stomach making her "loopy" and the fact the neurologist can not see her until end of November. Does not want to come in for appt but wants to know about her medication.   Referrals  GO TO FACILITY REFUSED   Disagree/Comply: Disagree  Disagree/Comply Reason: Disagree with instructions

## 2015-08-07 NOTE — Telephone Encounter (Signed)
Called and spoke with pt and pt is aware.  Advised pt that Kylie Olson states she can take ibuprofen if needed. Pt verbalized understanding.

## 2015-08-07 NOTE — Telephone Encounter (Signed)
A different migraine medication was sent to the pharmacy. This medication is called MaxAlt. Can take 1-2 tabs right away ( I recommend starting with 1 tab), if no improvement in two hours can take anther 1-2 tabs.

## 2015-09-17 ENCOUNTER — Encounter: Payer: Self-pay | Admitting: Neurology

## 2015-09-17 ENCOUNTER — Ambulatory Visit (INDEPENDENT_AMBULATORY_CARE_PROVIDER_SITE_OTHER): Payer: BC Managed Care – PPO | Admitting: Neurology

## 2015-09-17 VITALS — BP 108/56 | HR 91 | Ht 59.0 in | Wt 107.6 lb

## 2015-09-17 DIAGNOSIS — G43009 Migraine without aura, not intractable, without status migrainosus: Secondary | ICD-10-CM | POA: Diagnosis not present

## 2015-09-17 MED ORDER — VENLAFAXINE HCL ER 37.5 MG PO CP24: ORAL_CAPSULE | ORAL | Status: DC

## 2015-09-17 NOTE — Progress Notes (Signed)
NEUROLOGY CONSULTATION NOTE  Kylie Olson: [id number] DOB: 02-Jul-1991  Referring provider: Shirline Freesory Nafziger, NP Primary care provider: Worthy RancherPadonda Webb, NP  Reason for consult:  migraine  HISTORY OF PRESENT ILLNESS: Kylie Olson is a 24 year old left-handed female who presents for migraine.  History obtained by patient and PCP note.    Onset:  Once in a while since childhood but became frequent since March 2016 Location:  Bilateral retro-orbital, back of head or temples Quality:  Throbbing, pressure Intensity:  8/10 Aura:  no Prodrome:  no Associated symptoms:  Nausea, photophobia, phonophobia, sometimes blurred vision Duration:  Until she goes to sleep or starts to get relief after one hour with Maxalt Frequency:  8 to 12 headache days per month Triggers/exacerbating factors:  Stress, menstrual cycle Relieving factors:  Sleep, Maxalt  Past abortive medication:  Sumatriptan 50mg  (drowsiness, GI upset) Past preventative medication:  none Other past therapy:  none  Current abortive medication:  Maxalt 10mg , Zofran 4mg  Antihypertensive medications:  none Antidepressant medications:  none Anticonvulsant medications:  none Vitamins/Herbal/Supplements:  none  Caffeine:  No coffee or soda.  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  No family history of cerebral aneurysms.  PAST MEDICAL HISTORY: Past Medical History  Diagnosis Date  . Asthma     exercised induce    PAST SURGICAL HISTORY: Past Surgical History  Procedure Laterality Date  . Refractive surgery      MEDICATIONS: Current Outpatient Prescriptions on File Prior to Visit  Medication Sig Dispense Refill  . Acetaminophen (TYLENOL PO) Take by mouth as needed.    . ondansetron (ZOFRAN) 4 MG tablet Take 1 tablet (4 mg  total) by mouth every 8 (eight) hours as needed for nausea or vomiting. 20 tablet 0  . rizatriptan (MAXALT) 5 MG tablet Take 1 tablet (5 mg total) by mouth as needed for migraine. May repeat in 2 hours if needed 10 tablet 0   No current facility-administered medications on file prior to visit.    ALLERGIES: No Known Allergies  FAMILY HISTORY: Family History  Problem Relation Age of Onset  . Asthma Maternal Grandmother   . Cancer Maternal Grandmother     PANCREATIC  . Hypertension Maternal Grandmother   . Hypertension Maternal Grandfather   . Heart disease Maternal Grandfather     quatrupal bypass   . Irritable bowel syndrome Mother   . Hypertension Mother   . Thyroid disease Maternal Uncle   . Hypertension Maternal Aunt   . Lupus Maternal Aunt   . Diabetes Maternal Aunt     SOCIAL HISTORY: Social History   Social History  . Marital Status: Single    Spouse Name: N/A  . Number of Children: N/A  . Years of Education: N/A   Occupational History  . Not on file.   Social History Main Topics  . Smoking status: Never Smoker   . Smokeless tobacco: Never Used  . Alcohol Use: No  . Drug Use: No  . Sexual Activity: No   Other Topics Concern  . Not on file   Social History Narrative   Lives lives w/ father and mother. Has masters degree     REVIEW OF SYSTEMS: Constitutional: No fevers, chills, or sweats, no generalized fatigue, change in appetite Eyes: No visual changes, double vision, eye pain  Ear, nose and throat: No hearing loss, ear pain, nasal congestion, sore throat Cardiovascular: No chest pain, palpitations Respiratory:  No shortness of breath at rest or with exertion, wheezes GastrointestinaI: No nausea, vomiting, diarrhea, abdominal pain, fecal incontinence Genitourinary:  No dysuria, urinary retention or frequency Musculoskeletal:  No neck pain, back pain Integumentary: No rash, pruritus, skin lesions Neurological: as above Psychiatric: No depression,  insomnia, anxiety Endocrine: No palpitations, fatigue, diaphoresis, mood swings, change in appetite, change in weight, increased thirst Hematologic/Lymphatic:  No anemia, purpura, petechiae. Allergic/Immunologic: no itchy/runny eyes, nasal congestion, recent allergic reactions, rashes  PHYSICAL EXAM: Filed Vitals:   09/17/15 1414  BP: 108/56  Pulse: 91   General: No acute distress.  Patient appears well-groomed. Head:  Normocephalic/atraumatic Eyes:  fundi unremarkable, without vessel changes, exudates, hemorrhages or papilledema. Neck: supple, no paraspinal tenderness, full range of motion Back: No paraspinal tenderness Heart: regular rate and rhythm Lungs: Clear to auscultation bilaterally. Vascular: No carotid bruits. Neurological Exam: Mental status: alert and oriented to person, place, and time, recent and remote memory intact, fund of knowledge intact, attention and concentration intact, speech fluent and not dysarthric, language intact. Cranial nerves: CN I: not tested CN II: pupils equal, round and reactive to light, visual fields intact, fundi unremarkable, without vessel changes, exudates, hemorrhages or papilledema. CN III, IV, VI:  full range of motion, no nystagmus, no ptosis CN V: facial sensation intact CN VII: upper and lower face symmetric CN VIII: hearing intact CN IX, X: gag intact, uvula midline CN XI: sternocleidomastoid and trapezius muscles intact CN XII: tongue midline Bulk & Tone: normal, no fasciculations. Motor:  5/5 throughout  Sensation:  Pinprick and vibration sensation intact. Deep Tendon Reflexes:  2+ throughout, toes downgoing.  Finger to nose testing:  Without dysmetria.  Heel to shin:  Without dysmetria.  Gait:  Normal station and stride.  Able to turn and tandem walk. Romberg.  IMPRESSION: Menstrual associated migraine  PLAN: 1.  Start venlafaxine ER 37.5mg  daily and increasing to  daily 2.  Maxalt for abortive therapy 3.  She will  call in 4 weeks with update.  Follow up in 3 months.  Thank you for allowing me to take part in the care of this patient.  Shon Millet, DO  CC:  Shirline Frees, NP  Worthy Rancher, NP

## 2015-09-17 NOTE — Patient Instructions (Signed)
Migraine Recommendations: 1.  Start venlafaxine ER 37.5mg  capsules.  Take 1 capsule daily with breakfast for 7 days, then increase to 2 capsules daily.  Call in 4 weeks with update and we can adjust dose if needed. 2.  Take maxalt 10mg  at earliest onset of headache.  May repeat dose once in 2 hours if needed.  Do not exceed two tablets in 24 hours. 3.  Limit use of pain relievers to no more than 2 days out of the week.  These medications include acetaminophen, ibuprofen, triptans and narcotics.  This will help reduce risk of rebound headaches. 4.  Be aware of common food triggers such as processed sweets, processed foods with nitrites (such as deli meat, hot dogs, sausages), foods with MSG, alcohol (such as wine), chocolate, certain cheeses, certain fruits (dried fruits, some citrus fruit), vinegar, diet soda. 4.  Avoid caffeine 5.  Routine exercise 6.  Proper sleep hygiene 7.  Stay adequately hydrated with water 8.  Keep a headache diary. 9.  Maintain proper stress management. 10.  Do not skip meals. 11.  Consider supplements:  Magnesium oxide 400mg  to 600mg  daily, riboflavin 400mg , Coenzyme Q 10 100mg  three times daily 12.  Follow up in 3 months but call in 4 weeks with update.

## 2016-01-15 ENCOUNTER — Ambulatory Visit: Payer: BC Managed Care – PPO | Admitting: Neurology

## 2016-05-25 HISTORY — PX: WISDOM TOOTH EXTRACTION: SHX21

## 2017-01-12 ENCOUNTER — Ambulatory Visit (INDEPENDENT_AMBULATORY_CARE_PROVIDER_SITE_OTHER): Payer: BC Managed Care – PPO | Admitting: Family Medicine

## 2017-01-12 ENCOUNTER — Encounter: Payer: Self-pay | Admitting: Family Medicine

## 2017-01-12 DIAGNOSIS — K589 Irritable bowel syndrome without diarrhea: Secondary | ICD-10-CM | POA: Diagnosis not present

## 2017-01-12 DIAGNOSIS — G4700 Insomnia, unspecified: Secondary | ICD-10-CM | POA: Diagnosis not present

## 2017-01-12 DIAGNOSIS — G43009 Migraine without aura, not intractable, without status migrainosus: Secondary | ICD-10-CM | POA: Diagnosis not present

## 2017-01-12 DIAGNOSIS — J4520 Mild intermittent asthma, uncomplicated: Secondary | ICD-10-CM

## 2017-01-12 MED ORDER — ALBUTEROL SULFATE HFA 108 (90 BASE) MCG/ACT IN AERS: 2.0000 | INHALATION_SPRAY | Freq: Four times a day (QID) | RESPIRATORY_TRACT | 3 refills | Status: DC | PRN

## 2017-01-12 MED ORDER — RIZATRIPTAN BENZOATE 5 MG PO TABS: 5.0000 mg | ORAL_TABLET | ORAL | 5 refills | Status: DC | PRN

## 2017-01-12 NOTE — Progress Notes (Signed)
Phone: [phone]  Subjective:  Patient presents today to establish care with me as their new primary care provider. Patient was formerly a patient of Dr. Orvan Falconerampbell. Chief complaint-noted.   See problem oriented charting ROS- admits to headaches. Admits to stress/anxiety. Rare shortness of breath- has not been exercising. No chest pain  The following were reviewed and entered/updated in epic: Past Medical History:  Diagnosis Date  . Asthma    exercised induce   Patient Active Problem List   Diagnosis Date Noted  . Irritable bowel syndrome 01/12/2017    Priority: Medium  . Migraine without aura and without status migrainosus, not intractable 09/17/2015    Priority: Medium  . Asthma 07/27/2008    Priority: Medium  . Insomnia 12/10/2008    Priority: Low   Past Surgical History:  Procedure Laterality Date  . REFRACTIVE SURGERY     lasik    Family History  Problem Relation Age of Onset  . Asthma Maternal Grandmother   . Cancer Maternal Grandmother     PANCREATIC  . Hypertension Maternal Grandmother   . Irritable bowel syndrome Mother   . Hypertension Mother   . Thyroid disease Maternal Uncle   . Hypertension Maternal Aunt   . Lupus Maternal Aunt   . Diabetes Maternal Aunt   . Hypertension Maternal Grandfather   . Heart disease Maternal Grandfather     quatrupal bypass   . Healthy Father     Medications- reviewed and updated Current Outpatient Prescriptions  Medication Sig Dispense Refill  . Acetaminophen (TYLENOL PO) Take by mouth as needed.    . Flaxseed, Linseed, (FLAX SEEDS PO) Take by mouth.    . norethindrone-ethinyl estradiol (MICROGESTIN,JUNEL,LOESTRIN) 1-20 MG-MCG tablet Take 1 tablet by mouth daily.    [name]. albuterol (PROVENTIL HFA;VENTOLIN HFA) 108 (90 Base) MCG/ACT inhaler Inhale 2 puffs into the lungs every 6 (six) hours as needed for wheezing or shortness of breath. 1 Inhaler 3  . rizatriptan (MAXALT) 5 MG tablet Take 1 tablet (5 mg total) by mouth as  needed for migraine. May repeat in 2 hours if needed 10 tablet 5   No current facility-administered medications for this visit.     Allergies-reviewed and updated No Known Allergies  Social History   Social History  . Marital status: Single    Spouse name: N/A  . Number of children: N/A  . Years of education: N/A   Social History Main Topics  . Smoking status: Never Smoker  . Smokeless tobacco: Never Used  . Alcohol use 0.0 - 0.6 oz/week  . Drug use: No  . Sexual activity: No   Other Topics Concern  . None   Social History Narrative   Lives by herself- no roommates.       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    [employer] for undergrad and masters degree       Hobbies: time with family, tv- lifetime - enjoys laughing, napping    Objective: BP 124/78 (BP Location: Left Arm, Patient Position: Sitting, Cuff Size: Normal)   Pulse 100   Temp 98.8 F (37.1 C) (Oral)   Ht 4\' 11"  (1.499 m)   Wt 121 lb 9.6 oz (55.2 kg)   LMP 01/11/2017   SpO2 93%   BMI 24.56 kg/m  Gen: NAD, resting comfortably HEENT: Mucous membranes are moist. Oropharynx normal Neck: no thyromegaly CV: RRR no murmurs rubs or gallops Lungs: CTAB no crackles, wheeze, rhonchi Abdomen: soft/nontender/nondistended/normal bowel sounds. No rebound or  guarding.  Ext: no edema Skin: warm, dry Neuro: grossly normal, moves all extremities, PERRLA, equal upper extremity strength  Assessment/Plan:  Of note for decisions below- patient is compliant and consistent with birth control.   Migraine without aura and without status migrainosus, not intractable S: She does have migraines. Has seen neurology in the past- was prescribed mood type medicine but she never took it. Stress seems to trigger. Different locations often behind eyes. Sometimes just on one side sometimes bilaterally. Minimal nausea. Last whole day. Ibuprofen 3 tablets makes it bearable. Maxalt knocked off. 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 3-4 episodes each week.  A/P: we discussed preventative measures. She would like to trial maxalt again first. Given 3-4 episodes each week- some may be rebound. We discussed limiting treatment to twice a week. If not controlled in 2-3 months (will journal symptoms) then we will discuss options. She did not want to take the venlafaxine previously prescribed. Beta blocker vs. Amitriptyline with concurrent sleep issues are reasonable choices.   Insomnia S: Stress related. zquill helps. Gets to sleep but wakes up intermittently and hard to get back to sleep A/P: we discussed measures to reduce her stress in hopes this would help with her insomnia. Amitriptyline as noted above for migraines may also be reasonable choice. Discussed tolerance risks with zquill.   Irritable bowel syndrome S: recent issues with either constipation or large volume bowel movement. 4-5 bottles of water a day. No regular exercise. Usually gets at least one piece of fruit a day. Usually gets a veggie at most dinners- some lunches. Off and on flaxseed use. No family history of colon cancer but does have history IBS. Has lower abdominal pain before BM and resolves with movement. A lot of times goes to get bowel movement right after. nauzene otc helps.  Mom had IBS as well and similar symptoms (with patient today) A/P: suspect IBS mixed type picture. No red flags such as blood in stool. Hard to treat as mixed picture. we opted to schedule Flaxseed more regularly and increase exercise. ? Hyoscyamine down the line. Update [place] 2-3 months  Asthma S: Exercise mainly. Prn albuterol A/P: continue prn albuterol  2-3 months verbal  Meds ordered this encounter  Medications  . norethindrone-ethinyl estradiol (MICROGESTIN,JUNEL,LOESTRIN) 1-20 MG-MCG tablet    Sig: Take 1 tablet by mouth daily.  . rizatriptan (MAXALT) 5 MG tablet    Sig: Take 1 tablet (5 mg total) by mouth as needed for migraine. May repeat in 2 hours if  needed    Dispense:  10 tablet    Refill:  5  . albuterol (PROVENTIL HFA;VENTOLIN HFA) 108 (90 Base) MCG/ACT inhaler    Sig: Inhale 2 puffs into the lungs every 6 (six) hours as needed for wheezing or shortness of breath.    Dispense:  1 Inhaler    Refill:  3    Return precautions advised.   [name], MD

## 2017-01-12 NOTE — Assessment & Plan Note (Signed)
S: Exercise mainly. Prn albuterol A/P: continue prn albuterol

## 2017-01-12 NOTE — Assessment & Plan Note (Addendum)
S: Stress related. zquill helps. Gets to sleep but wakes up intermittently and hard to get back to sleep A/P: we discussed measures to reduce her stress in hopes this would help with her insomnia. Amitriptyline as noted above for migraines may also be reasonable choice. Discussed tolerance risks with zquill.

## 2017-01-12 NOTE — Assessment & Plan Note (Signed)
S: She does have migraines. Has seen neurology in the past- was prescribed mood type medicine but she never took it. Stress seems to trigger. Different locations often behind eyes. Sometimes just on one side sometimes bilaterally. Minimal nausea. Last whole day. Ibuprofen 3 tablets makes it bearable. Maxalt knocked off. 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  A/P: we discussed preventative measures. She would like to trial maxalt again first. Given 3-4 episodes each week- some may be rebound. We discussed limiting treatment to twice a week. If not controlled in 2-3 months (will journal symptoms) then we will discuss options. She did not want to take the venlafaxine previously prescribed. Beta blocker vs. Amitriptyline with concurrent sleep issues are reasonable choices.

## 2017-01-12 NOTE — Assessment & Plan Note (Addendum)
S: recent issues with either constipation or large volume bowel movement. 4-5 bottles of water a day. No regular exercise. Usually gets at least one piece of fruit a day. Usually gets a veggie at most dinners- some lunches. Off and on flaxseed use. No family history of colon cancer but does have history IBS. Has lower abdominal pain before BM and resolves with movement. A lot of times goes to get bowel movement right after. nauzene otc helps.  Mom had IBS as well and similar symptoms (with patient today) A/P: suspect IBS mixed type picture. No red flags such as blood in stool. Hard to treat as mixed picture. we opted to schedule Flaxseed more regularly and increase exercise. ? Hyoscyamine down the line. Update us 2-3 months

## 2017-01-12 NOTE — Patient Instructions (Signed)
Sign release of information at the check out desk for last pap smear  Refilled maxalt. Would use twice a week maximum. See if you can cut the ibuprofen as well for the next 1-2 weeks in case rebound element.   Journal headaches- may need to consider using preventative medicine. Review options on handout.   Great job on water and veggies intake. Lets try to get you exercising at least 150 minutes a week. May help with bowels as well as headaches.   Also go back to regular flaxseed. May consider medicine like hyoscyamine for cramps

## 2017-01-12 NOTE — Progress Notes (Signed)
Pre visit review using our clinic review tool, if applicable. No additional management support is needed unless otherwise documented below in the visit note. 

## 2017-07-23 ENCOUNTER — Ambulatory Visit (INDEPENDENT_AMBULATORY_CARE_PROVIDER_SITE_OTHER): Payer: BC Managed Care – PPO | Admitting: Family Medicine

## 2017-07-23 ENCOUNTER — Encounter: Payer: Self-pay | Admitting: Family Medicine

## 2017-07-23 VITALS — BP 132/86 | HR 82 | Temp 98.3°F | Ht 59.0 in | Wt 124.8 lb

## 2017-07-23 DIAGNOSIS — M25512 Pain in left shoulder: Secondary | ICD-10-CM | POA: Diagnosis not present

## 2017-07-23 DIAGNOSIS — M549 Dorsalgia, unspecified: Secondary | ICD-10-CM | POA: Diagnosis not present

## 2017-07-23 MED ORDER — CYCLOBENZAPRINE HCL 5 MG PO TABS: 5.0000 mg | ORAL_TABLET | Freq: Two times a day (BID) | ORAL | 0 refills | Status: DC | PRN
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
## 2017-08-13 ENCOUNTER — Ambulatory Visit: Payer: BC Managed Care – PPO | Admitting: Family Medicine

## 2017-08-13 ENCOUNTER — Encounter: Payer: Self-pay | Admitting: Family Medicine

## 2017-08-13 ENCOUNTER — Ambulatory Visit (INDEPENDENT_AMBULATORY_CARE_PROVIDER_SITE_OTHER): Payer: BC Managed Care – PPO | Admitting: Family Medicine

## 2017-08-13 VITALS — BP 122/80 | HR 98 | Ht 59.0 in | Wt 124.8 lb

## 2017-08-13 DIAGNOSIS — M25512 Pain in left shoulder: Secondary | ICD-10-CM

## 2017-08-13 DIAGNOSIS — M549 Dorsalgia, unspecified: Secondary | ICD-10-CM

## 2017-08-13 MED ORDER — MELOXICAM 7.5 MG PO TABS: 7.5000 mg | ORAL_TABLET | Freq: Every day | ORAL | 0 refills | Status: DC

## 2017-08-13 MED ORDER — BACLOFEN 10 MG PO TABS: 10.0000 mg | ORAL_TABLET | Freq: Three times a day (TID) | ORAL | 0 refills | Status: DC
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
## 2017-08-13 NOTE — Patient Instructions (Signed)
Start the mobic and baclofen.  Continue using heating pads.  Try the exercises.   Come back in another 3-4 weeks if your symptoms are not improving.  Take care,  Dr Jimmey RalphParker

## 2017-09-27 ENCOUNTER — Ambulatory Visit: Payer: BC Managed Care – PPO | Admitting: Family Medicine

## 2017-09-27 ENCOUNTER — Encounter: Payer: Self-pay | Admitting: Family Medicine

## 2017-09-27 ENCOUNTER — Ambulatory Visit (INDEPENDENT_AMBULATORY_CARE_PROVIDER_SITE_OTHER): Payer: BC Managed Care – PPO

## 2017-09-27 VITALS — BP 108/72 | HR 80 | Temp 98.5°F | Ht 59.0 in | Wt 124.6 lb

## 2017-09-27 DIAGNOSIS — M545 Low back pain, unspecified: Secondary | ICD-10-CM

## 2017-09-27 NOTE — Patient Instructions (Signed)
When was your last period  Stop by x-ray- you are good to leave after that  Try ice when pain worsens for 3 days- 20 minutes 3-4x a day  Let me know if not improving in 2-3 weeks and we can get you into Dr. Berline Choughigby
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
## 2017-11-05 ENCOUNTER — Encounter: Payer: Self-pay | Admitting: Family Medicine

## 2018-04-15 ENCOUNTER — Other Ambulatory Visit: Payer: Self-pay | Admitting: Family Medicine

## 2018-05-27 ENCOUNTER — Encounter: Payer: Self-pay | Admitting: Family Medicine

## 2018-06-01 ENCOUNTER — Ambulatory Visit: Payer: Self-pay | Admitting: Family Medicine

## 2018-06-01 NOTE — Telephone Encounter (Signed)
Pt called c/o shortness of breath and wheezing occasionally  And chest tightness during exercise but mainly 30 to 45 minutes after exercise. Pt stated that sometimes has to stop exercising due to symptoms. After she stops exercising she uses her Albuterol inhaler. Pt was not having any difficulty breathing during call. Pt stated that she was diagnosed with a heart murmur as a child. Her maternal and paternal have heart issues. Pt stated her mother had heart surgery at age 28 years old. Pt stated that she was previously  diagnosed with exercise induced asthma. Care advice given to pt per protocol. Pt verbalized understanding.  No availability with PCP. Appointment offered to see another provider. Appt given for  06/03/18 at 1:40 pm. Pt verbalized understanding of appointment.  Reason for Disposition . [1] MODERATE longstanding difficulty breathing (e.g., speaks in phrases, SOB even at rest, pulse 100-120) AND [2] SAME as normal    No symptoms at time of call. Symptoms are exercise induced  Answer Assessment - Initial Assessment Questions 1. RESPIRATORY STATUS: "Describe your breathing?" (e.g., wheezing, shortness of breath, unable to speak, severe coughing)      With exertion, pt develops shortness of breath and wheezing. These sx last for 30-45 minutes after she has stopped exercise. Sometimes pt has to stop exercise due to symptoms. 2. ONSET: "When did this breathing problem begin?"       2 weeks ago 3. PATTERN "Does the difficult breathing come and go, or has it been constant since it started?"     Constant during exercise and constant for 30-45 minutes after activity, then symptoms stop 4. SEVERITY: "How bad is your breathing?" (e.g., mild, moderate, severe)    - MILD: No SOB at rest, mild SOB with walking, speaks normally in sentences, can lay down, no retractions, pulse < 100.    - MODERATE: SOB at rest, SOB with minimal exertion and prefers to sit, cannot lie down flat, speaks in phrases,  mild retractions, audible wheezing, pulse 100-120.    - SEVERE: Very SOB at rest, speaks in single words, struggling to breathe, sitting hunched forward, retractions, pulse > 120      No symptoms at time of call 5. RECURRENT SYMPTOM: "Have you had difficulty breathing before?" If so, ask: "When was the last time?" and "What happened that time?"      no 6. CARDIAC HISTORY: "Do you have any history of heart disease?" (e.g., heart attack, angina, bypass surgery, angioplasty)      Was dx with heart murmur when she was a child. Mother had heart surgery at age 28 and maternal Grandfather had heart surgery twice, Paternal grandmother with heart attack 7. LUNG HISTORY: "Do you have any history of lung disease?"  (e.g., pulmonary embolus, asthma, emphysema)     Exercise induced asthma in approximately  2008 8. CAUSE: "What do you think is causing the breathing problem?"      Pt stated she does not know 9. OTHER SYMPTOMS: "Do you have any other symptoms? (e.g., dizziness, runny nose, cough, chest pain, fever)     Chest tightness, wheezing, and dizziness with exercise and 30-45 minutes after exercise 10. PREGNANCY: "Is there any chance you are pregnant?" "When was your last menstrual period?"      No- LMP: on menses now 11. TRAVEL: "Have you traveled out of the country in the last month?" (e.g., travel history, exposures)       no  Protocols used: BREATHING DIFFICULTY-A-AH

## 2018-06-02 NOTE — Progress Notes (Deleted)
   Kylie Olson is a 27 y.o. female here for an acute visit.  History of Present Illness:   {CMA SCRIBE ATTESTATION}  HPI:   PMHx, SurgHx, SocialHx, Medications, and Allergies were reviewed in the Visit Navigator and updated as appropriate.  Current Medications:   Current Outpatient Medications:  .  Acetaminophen (TYLENOL PO), Take by mouth as needed., Disp: , Rfl:  .  albuterol (PROVENTIL HFA;VENTOLIN HFA) 108 (90 Base) MCG/ACT inhaler, Inhale 2 puffs into the lungs every 6 (six) hours as needed for wheezing or shortness of breath., Disp: 1 Inhaler, Rfl: 3 .  baclofen (LIORESAL) 10 MG tablet, Take 1 tablet (10 mg total) by mouth 3 (three) times daily., Disp: 30 each, Rfl: 0 .  Flaxseed, Linseed, (FLAX SEEDS PO), Take by mouth., Disp: , Rfl:  .  JUNEL FE 1/20 1-20 MG-MCG tablet, , Disp: , Rfl: 1 .  meloxicam (MOBIC) 7.5 MG tablet, Take 1 tablet (7.5 mg total) by mouth daily., Disp: 30 tablet, Rfl: 0 .  rizatriptan (MAXALT) 5 MG tablet, TAKE 1 TABLET IF NEEDED FOR MIGRAINES MAY REPEAT IN 2 HOURS IF NEEDED, Disp: 10 tablet, Rfl: 0   No Known Allergies Review of Systems:   Pertinent items are noted in the HPI. Otherwise, ROS is negative.  Vitals:  There were no vitals filed for this visit.   There is no height or weight on file to calculate BMI.  Physical Exam:   Physical Exam  Results for orders placed or performed in visit on 11/05/17  HM PAP SMEAR  Result Value Ref Range   HM Pap smear Negative     Assessment and Plan:   There are no diagnoses linked to this encounter.  . Reviewed expectations re: course of current medical issues. . Discussed self-management of symptoms. . Outlined signs and symptoms indicating need for more acute intervention. . Patient verbalized understanding and all questions were answered. [name]. Health Maintenance issues including appropriate healthy diet, exercise, and smoking avoidance were discussed with patient. . See orders for this visit as  documented in the electronic medical record. . Patient received an After Visit Summary.  *** CMA served as Neurosurgeonscribe during this visit. History, Physical, and Plan performed by medical provider. The above documentation has been reviewed and is accurate and complete. Helane RimaErica Mateja Dier, D.O.  Helane RimaErica Hakeem Frazzini, DO [place], [facility] 06/02/2018

## 2018-06-03 ENCOUNTER — Encounter: Payer: Self-pay | Admitting: Family Medicine

## 2018-06-03 ENCOUNTER — Ambulatory Visit: Payer: BC Managed Care – PPO | Admitting: Family Medicine

## 2018-06-03 ENCOUNTER — Telehealth: Payer: Self-pay

## 2018-06-03 ENCOUNTER — Telehealth: Payer: Self-pay | Admitting: *Deleted

## 2018-06-03 VITALS — BP 126/82 | HR 99 | Temp 98.7°F | Ht 59.0 in | Wt 129.4 lb

## 2018-06-03 DIAGNOSIS — J4540 Moderate persistent asthma, uncomplicated: Secondary | ICD-10-CM | POA: Diagnosis not present

## 2018-06-03 MED ORDER — BECLOMETHASONE DIPROP HFA 80 MCG/ACT IN AERB: 2.0000 | INHALATION_SPRAY | Freq: Two times a day (BID) | RESPIRATORY_TRACT | 5 refills | Status: DC

## 2018-06-03 NOTE — Telephone Encounter (Signed)
Called and spoke to patient who states that she was originally told that Dr. Durene CalHunter was full today and that Dr. Jimmey RalphParker was out of the office. She is scheduled with Earlene PlaterWallace this afternoon. She was confused as she was called and we offered her an 11:45 slot with Dr. Durene CalHunter. She is at work and can not come at 11:45 this morning. She wanted clarification of why she was offered this slot after being told Dr. Durene CalHunter was full.

## 2018-06-03 NOTE — Patient Instructions (Signed)
Start qvar 2 puffs twice a day for the next month  See me in 3-4 weeks for recheck  Can continue to use albuterol before or after exercise but suspect you will need this less as you continue on the qvar

## 2018-06-03 NOTE — Telephone Encounter (Signed)
Called patient to inform her we moved up her 1:40 appointment with Dr. Earlene PlaterWallace to 11:45 with Dr. Durene CalHunter. Patient did not answer. I left a voicemail to call office to let Kylie Olson know if she can make it or not.

## 2018-06-03 NOTE — Assessment & Plan Note (Addendum)
S: history of asthma. In the past month or so she has been trying to get back into working out more. Has done about 3 days a week. With exercise she feels extremely winded- wheezing some and chest feels tight. Feels dizzy and some palpitations. Takes 30-45 minutes to feel back to normal after the exercise. Has tried before and after exercise and gets similar results. Last week walking across campus- watch said HR was above target and she felt winded and wheezy . A/P: Poor control of asthma- now moderate persistent. Add qvar 80 mcg/inhalation 2 puffs BID and recheck 3-4 weeks. Family and patient are concerned about cardiac causes- we discussed if helping wheezing/shortness of breath with inhaler medication doesn't help we could consider EKG at follow up but suspect low risk cardiac given known asthma and current asthma symptoms

## 2018-06-03 NOTE — Telephone Encounter (Signed)
Copied from CRM [id number]. Topic: Inquiry >> Jun 03, 2018  9:29 AM Yvonna Alanisobinson, Andra M wrote: Reason for CRM: Patient called requesting to speak with Dr. Erasmo LeventhalHunter's assistant Asher MuirJamie regarding the patient's appt with Helane RimaErica Wallace later today. Patient requests that Asher MuirJamie call her this morning before her appt this afternoon.       Thank You!!!

## 2018-06-03 NOTE — Progress Notes (Signed)
Subjective:  Kylie MainsJasmine C Olson is a 27 y.o. year old very pleasant female patient who presents for/with See problem oriented charting ROS- wheezing, shortness of breath, chest tightness with exercise. No fever or chills.    Past Medical History-  Patient Active Problem List   Diagnosis Date Noted  . Irritable bowel syndrome 01/12/2017    Priority: Medium  . Migraine without aura and without status migrainosus, not intractable 09/17/2015    Priority: Medium  . Asthma 07/27/2008    Priority: Medium  . Insomnia 12/10/2008    Priority: Low    Medications- reviewed and updated Current Outpatient Medications  Medication Sig Dispense Refill  . Acetaminophen (TYLENOL PO) Take by mouth as needed.    [name]. albuterol (PROVENTIL HFA;VENTOLIN HFA) 108 (90 Base) MCG/ACT inhaler Inhale 2 puffs into the lungs every 6 (six) hours as needed for wheezing or shortness of breath. 1 Inhaler 3  . Flaxseed, Linseed, (FLAX SEEDS PO) Take by mouth.    [name]. JUNEL FE 1/20 1-20 MG-MCG tablet   1  . rizatriptan (MAXALT) 5 MG tablet TAKE 1 TABLET IF NEEDED FOR MIGRAINES MAY REPEAT IN 2 HOURS IF NEEDED 10 tablet 0  . beclomethasone (QVAR REDIHALER) 80 MCG/ACT inhaler Inhale 2 puffs into the lungs 2 (two) times daily. 1 Inhaler 5   No current facility-administered medications for this visit.     Objective: BP 126/82 (BP Location: Left Arm, Patient Position: Sitting, Cuff Size: Normal)   Pulse 99   Temp 98.7 F (37.1 C) (Oral)   Ht 4\' 11"  (1.499 m)   Wt 129 lb 6.4 oz (58.7 kg)   LMP 05/30/2018   SpO2 (!) 65%   BMI 26.14 kg/m  Gen: NAD, resting comfortably CV: RRR no murmurs rubs or gallops Lungs: CTAB no crackles, wheeze, rhonchi Abdomen: soft/nontender/nondistended/normal bowel sounds  Ext: no edema, raised skin in areas that she scratches like left elbow Skin: warm, dry  Assessment/Plan:   Asthma S: history of asthma. In the past month or so she has been trying to get back into working out more. Has done  about 3 days a week. With exercise she feels extremely winded- wheezing some and chest feels tight. Feels dizzy and some palpitations. Takes 30-45 minutes to feel back to normal after the exercise. Has tried before and after exercise and gets similar results. Last week walking across campus- watch said HR was above target and she felt winded and wheezy . A/P: Poor control of asthma- now moderate persistent. Add qvar 80 mcg/inhalation 2 puffs BID and recheck 3-4 weeks. Family and patient are concerned about cardiac causes- we discussed if helping wheezing/shortness of breath with inhaler medication doesn't help we could consider EKG at follow up but suspect low risk cardiac given known asthma and current asthma symptoms    Future Appointments  Date Time Provider Department Center  07/04/2018  3:30 PM Shelva MajesticHunter, Robinn Overholt O, MD [facility]    Meds ordered this encounter  Medications  . beclomethasone (QVAR REDIHALER) 80 MCG/ACT inhaler    Sig: Inhale 2 puffs into the lungs 2 (two) times daily.    Dispense:  1 Inhaler    Refill:  5    Return precautions advised.  Tana ConchStephen Souleymane Saiki, MD

## 2018-06-03 NOTE — Progress Notes (Deleted)
   Kylie SpinnerJasmine C Olson is a 27 y.o. female here for an acute visit.  History of Present Illness:   {CMA SCRIBE ATTESTATION}  HPI:   PMHx, SurgHx, SocialHx, Medications, and Allergies were reviewed in the Visit Navigator and updated as appropriate.  Current Medications:   Current Outpatient Medications:  .  Acetaminophen (TYLENOL PO), Take by mouth as needed., Disp: , Rfl:  .  albuterol (PROVENTIL HFA;VENTOLIN HFA) 108 (90 Base) MCG/ACT inhaler, Inhale 2 puffs into the lungs every 6 (six) hours as needed for wheezing or shortness of breath., Disp: 1 Inhaler, Rfl: 3 .  baclofen (LIORESAL) 10 MG tablet, Take 1 tablet (10 mg total) by mouth 3 (three) times daily., Disp: 30 each, Rfl: 0 .  Flaxseed, Linseed, (FLAX SEEDS PO), Take by mouth., Disp: , Rfl:  .  JUNEL FE 1/20 1-20 MG-MCG tablet, , Disp: , Rfl: 1 .  meloxicam (MOBIC) 7.5 MG tablet, Take 1 tablet (7.5 mg total) by mouth daily., Disp: 30 tablet, Rfl: 0 .  rizatriptan (MAXALT) 5 MG tablet, TAKE 1 TABLET IF NEEDED FOR MIGRAINES MAY REPEAT IN 2 HOURS IF NEEDED, Disp: 10 tablet, Rfl: 0   No Known Allergies Review of Systems:   Pertinent items are noted in the HPI. Otherwise, ROS is negative.  Vitals:  There were no vitals filed for this visit.   There is no height or weight on file to calculate BMI.  Physical Exam:   Physical Exam  Results for orders placed or performed in visit on 11/05/17  HM PAP SMEAR  Result Value Ref Range   HM Pap smear Negative     Assessment and Plan:   There are no diagnoses linked to this encounter.  . Reviewed expectations re: course of current medical issues. . Discussed self-management of symptoms. . Outlined signs and symptoms indicating need for more acute intervention. . Patient verbalized understanding and all questions were answered. [name]. Health Maintenance issues including appropriate healthy diet, exercise, and smoking avoidance were discussed with patient. . See orders for this visit as  documented in the electronic medical record. . Patient received an After Visit Summary.  *** CMA served as Neurosurgeonscribe during this visit. History, Physical, and Plan performed by medical provider. The above documentation has been reviewed and is accurate and complete. Helane RimaErica Lakeria Starkman, D.O.  Helane RimaErica Decklan Mau, DO , [facility] 06/03/2018

## 2018-07-04 ENCOUNTER — Ambulatory Visit: Payer: BC Managed Care – PPO | Admitting: Family Medicine

## 2018-07-08 ENCOUNTER — Ambulatory Visit: Payer: BC Managed Care – PPO | Admitting: Family Medicine

## 2018-11-03 LAB — HM PAP SMEAR: HM PAP: NEGATIVE

## 2018-11-08 ENCOUNTER — Encounter: Payer: Self-pay | Admitting: Family Medicine

## 2018-11-14 ENCOUNTER — Ambulatory Visit (INDEPENDENT_AMBULATORY_CARE_PROVIDER_SITE_OTHER): Payer: BC Managed Care – PPO | Admitting: Physician Assistant

## 2018-11-14 ENCOUNTER — Encounter: Payer: Self-pay | Admitting: Physician Assistant

## 2018-11-14 VITALS — BP 110/72 | HR 74 | Temp 98.2°F | Ht 59.0 in | Wt 131.8 lb

## 2018-11-14 DIAGNOSIS — G43009 Migraine without aura, not intractable, without status migrainosus: Secondary | ICD-10-CM | POA: Diagnosis not present

## 2018-11-14 MED ORDER — KETOROLAC TROMETHAMINE 10 MG PO TABS: 10.0000 mg | ORAL_TABLET | Freq: Four times a day (QID) | ORAL | 0 refills | Status: DC | PRN

## 2018-11-14 MED ORDER — KETOROLAC TROMETHAMINE 60 MG/2ML IM SOLN
60.0000 mg | Freq: Once | INTRAMUSCULAR | Status: AC
  Administered 2018-11-14: 60 mg via INTRAMUSCULAR

## 2018-11-14 MED ORDER — PROMETHAZINE HCL 25 MG/ML IJ SOLN
25.0000 mg | Freq: Once | INTRAMUSCULAR | Status: AC
  Administered 2018-11-14: 25 mg via INTRAMUSCULAR

## 2018-11-14 NOTE — Progress Notes (Signed)
Kylie Olson is a 28 y.o. female here for a new problem.  History of Present Illness:   Chief Complaint  Patient presents with  . Migraine    x 2 days    Migraine   This is a new problem. The current episode started yesterday. The problem occurs constantly. The problem has been gradually worsening. The pain is located in the temporal region. The pain does not radiate. The pain quality is similar to prior headaches. The quality of the pain is described as pulsating and throbbing. The pain is at a severity of 7/10. The pain is moderate. Associated symptoms include phonophobia, photophobia and tinnitus. Pertinent negatives include no abdominal pain, anorexia, back pain, blurred vision, dizziness, nausea, neck pain, sore throat, swollen glands, vomiting or weakness. The symptoms are aggravated by menstrual cycle and bright light. The treatment provided moderate relief. Her past medical history is significant for migraine headaches and migraines in the family. There is no history of obesity or recent head traumas.    She tried Maxalt and Ibuprofen.  States that this migraine started like her other migraines. Denies thunderclap onset.  Mother also denies patient with any confusion, slurred speech, etc.   Past Medical History:  Diagnosis Date  . Asthma    exercised induce     Social History   Socioeconomic History  . Marital status: Single    Spouse name: Not on file  . Number of children: Not on file  . Years of education: Not on file  . Highest education level: Not on file  Occupational History  . Not on file  Social Needs  . Financial resource strain: Not on file  . Food insecurity:    Worry: Not on file    Inability: Not on file  . Transportation needs:    Medical: Not on file    Non-medical: Not on file  Tobacco Use  . Smoking status: Never Smoker  . Smokeless tobacco: Never Used  Substance and Sexual Activity  . Alcohol use: Yes    Alcohol/week: 0.0 - 1.0 standard  drinks  . Drug use: No  . Sexual activity: Never    Birth control/protection: None  Lifestyle  . Physical activity:    Days per week: Not on file    Minutes per session: Not on file  . Stress: Not on file  Relationships  . Social connections:    Talks on phone: Not on file    Gets together: Not on file    Attends religious service: Not on file    Active member of club or organization: Not on file    Attends meetings of clubs or organizations: Not on file    Relationship status: Not on file  . Intimate partner violence:    Fear of current or ex partner: Not on file    Emotionally abused: Not on file    Physically abused: Not on file    Forced sexual activity: Not on file  Other Topics Concern  . Not on file  Social History Narrative   Lives by herself- no roommates.       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    [employer] for undergrad and masters degree       Hobbies: time with family, tv- lifetime - enjoys laughing, napping    Past Surgical History:  Procedure Laterality Date  . REFRACTIVE SURGERY     lasik    Family History  Problem Relation Age of  Onset  . Asthma Maternal Grandmother   . Cancer Maternal Grandmother        PANCREATIC  . Hypertension Maternal Grandmother   . Irritable bowel syndrome Mother   . Hypertension Mother   . Thyroid disease Maternal Uncle   . Hypertension Maternal Aunt   . Lupus Maternal Aunt   . Diabetes Maternal Aunt   . Hypertension Maternal Grandfather   . Heart disease Maternal Grandfather        quatrupal bypass   . Healthy Father     No Known Allergies  Current Medications:   Current Outpatient Medications:  .  Acetaminophen (TYLENOL PO), Take by mouth as needed., Disp: , Rfl:  .  albuterol (PROVENTIL HFA;VENTOLIN HFA) 108 (90 Base) MCG/ACT inhaler, Inhale 2 puffs into the lungs every 6 (six) hours as needed for wheezing or shortness of breath., Disp: 1 Inhaler, Rfl: 3 .  Flaxseed, Linseed, (FLAX SEEDS  PO), Take by mouth., Disp: , Rfl:  .  JUNEL FE 1/20 1-20 MG-MCG tablet, , Disp: , Rfl: 1 .  rizatriptan (MAXALT) 5 MG tablet, TAKE 1 TABLET IF NEEDED FOR MIGRAINES MAY REPEAT IN 2 HOURS IF NEEDED, Disp: 10 tablet, Rfl: 0   Review of Systems:   Review of Systems  HENT: Positive for tinnitus. Negative for sore throat.   Eyes: Positive for photophobia. Negative for blurred vision.  Gastrointestinal: Negative for abdominal pain, anorexia, nausea and vomiting.  Musculoskeletal: Negative for back pain and neck pain.  Neurological: Negative for dizziness and weakness.    Vitals:   Vitals:   11/14/18 1547  BP: 110/72  Pulse: 74  Temp: 98.2 F (36.8 C)  TempSrc: Oral  SpO2: 99%  Weight: 131 lb 12 oz (59.8 kg)  Height: 4\' 11"  (1.499 m)     Body mass index is 26.61 kg/m.  Physical Exam:   Physical Exam Vitals signs and nursing note reviewed.  Constitutional:      General: She is not in acute distress.    Appearance: She is well-developed. She is not ill-appearing or toxic-appearing.  HENT:     Head: Normocephalic and atraumatic.     Right Ear: Tympanic membrane, ear canal and external ear normal. Tympanic membrane is not erythematous, retracted or bulging.     Left Ear: Tympanic membrane, ear canal and external ear normal. Tympanic membrane is not erythematous, retracted or bulging.     Nose: Nose normal.     Right Sinus: No maxillary sinus tenderness or frontal sinus tenderness.     Left Sinus: No maxillary sinus tenderness or frontal sinus tenderness.     Mouth/Throat:     Pharynx: Uvula midline. No posterior oropharyngeal erythema.  Eyes:     General: Lids are normal.     Conjunctiva/sclera: Conjunctivae normal.  Neck:     Trachea: Trachea normal.  Cardiovascular:     Rate and Rhythm: Normal rate and regular rhythm.     Heart sounds: Normal heart sounds, S1 normal and S2 normal.  Pulmonary:     Effort: Pulmonary effort is normal.     Breath sounds: Normal breath  sounds. No decreased breath sounds, wheezing, rhonchi or rales.  Lymphadenopathy:     Cervical: No cervical adenopathy.  Skin:    General: Skin is warm and dry.  Neurological:     Mental Status: She is alert.     GCS: GCS eye subscore is 4. GCS verbal subscore is 5. GCS motor subscore is 6.     Cranial  Nerves: Cranial nerves are intact.     Sensory: Sensation is intact.     Motor: Motor function is intact.     Coordination: Coordination normal.     Gait: Gait normal.  Psychiatric:        Speech: Speech normal.        Behavior: Behavior normal. Behavior is cooperative.       Assessment and Plan:   Eilidh was seen today for migraine.  Diagnoses and all orders for this visit:  Migraine without aura and without status migrainosus, not intractable No red flags on exam. Received toradol and phenergan in office and tolerated well. May take oral Toradol tomorrow if needed. I did provide red flags, and stated that if symptoms worsen, go to the ER. She is interested in starting possible preventative medications -- will defer to PCP.  -     ketorolac (TORADOL) injection 60 mg -     promethazine (PHENERGAN) injection 25 mg   . Reviewed expectations re: course of current medical issues. . Discussed self-management of symptoms. . Outlined signs and symptoms indicating need for more acute intervention. . Patient verbalized understanding and all questions were answered. . See orders for this visit as documented in the electronic medical record. . Patient received an After-Visit Summary.  CMA or LPN served as scribe during this visit. History, Physical, and Plan performed by medical provider. The above documentation has been reviewed and is accurate and complete.   Jarold Motto, PA-C

## 2018-11-14 NOTE — Patient Instructions (Signed)
It was great to see you!  Go home and get some rest!  You may take the oral toradol medication starting tomorrow if needed for worsening pain.  Take care,  Kylie MottoSamantha Allenmichael Mcpartlin PA-C  Get help right away if:  Your migraine gets very bad.  You have a fever.  You have a stiff neck.  You have trouble seeing.  Your muscles feel weak or like you cannot control them.  You start to lose your balance a lot.  You start to have trouble walking.  You pass out (faint). This information is not intended to replace advice given to you by your health care provider. Make sure you discuss any questions you have with your health care provider.

## 2018-11-15 ENCOUNTER — Telehealth: Payer: Self-pay | Admitting: Physician Assistant

## 2018-11-15 NOTE — Telephone Encounter (Signed)
Left message on voicemail to call office. Called to see how pt is feeling?

## 2018-11-15 NOTE — Telephone Encounter (Signed)
Please call patient and see how she is feeling.  Jarold Motto PA-C

## 2018-11-16 NOTE — Telephone Encounter (Addendum)
Spoke to pt told her just following up to see if her Migraine is better? Pt said she is better, had slight headache yesterday, but has resolved today. Told her okay good. Samantha notified pt is feeling better.

## 2019-01-12 ENCOUNTER — Other Ambulatory Visit: Payer: Self-pay | Admitting: Family

## 2019-03-03 ENCOUNTER — Encounter: Payer: Self-pay | Admitting: Family Medicine

## 2019-03-03 ENCOUNTER — Ambulatory Visit (INDEPENDENT_AMBULATORY_CARE_PROVIDER_SITE_OTHER): Payer: BC Managed Care – PPO | Admitting: Family Medicine

## 2019-03-03 VITALS — Ht 59.0 in

## 2019-03-03 DIAGNOSIS — R21 Rash and other nonspecific skin eruption: Secondary | ICD-10-CM

## 2019-03-03 MED ORDER — TRIAMCINOLONE ACETONIDE 0.1 % EX CREA
1.0000 | TOPICAL_CREAM | Freq: Two times a day (BID) | CUTANEOUS | 1 refills | Status: DC
Start: 2019-03-03 — End: 2020-07-04

## 2019-03-03 NOTE — Patient Instructions (Signed)
There are no preventive care reminders to display for this patient.  Depression screen [facility] 2/9 03/03/2019 09/27/2017  Decreased Interest 0 0  Down, Depressed, Hopeless 0 0  PHQ - 2 Score 0 0   Video visit

## 2019-03-03 NOTE — Progress Notes (Signed)
Phone [phone]   Subjective:  Virtual visit via Video note. Chief complaint: Chief Complaint  Patient presents with  . Bumps under armpit   This visit type was conducted due to national recommendations for restrictions regarding the COVID-19 Pandemic (e.g. social distancing).  This format is felt to be most appropriate for this patient at this time balancing risks to patient and risks to population by having him in for in person visit.  No physical exam was performed (except for noted visual exam or audio findings with Telehealth visits).    Our team/I connected with Artavia C Binz on 03/03/19 at  1:00 PM EDT by a video enabled telemedicine application ([email or web address]) and verified that I am speaking with the correct person using two identifiers.  Location patient: [place] Location provider: [facility], office Persons participating in the virtual visit:  patient  Our team/I discussed the limitations of evaluation and management by telemedicine and the availability of in person appointments. In light of current covid-19 pandemic, patient also understands that we are trying to protect them by minimizing in office contact if at all possible.  The patient expressed consent for telemedicine visit and agreed to proceed. Patient understands insurance will be billed.   ROS-not ill appearing, no fever/chills. No new medications. Not immunocompromised. No mucus membrane involvement. No nausea/vomiting   Past Medical History-  Patient Active Problem List   Diagnosis Date Noted  . Irritable bowel syndrome 01/12/2017    Priority: Medium  . Migraine without aura and without status migrainosus, not intractable 09/17/2015    Priority: Medium  . Asthma 07/27/2008    Priority: Medium  . Insomnia 12/10/2008    Priority: Low    Medications- reviewed and updated Current Outpatient Medications  Medication Sig Dispense Refill  . Acetaminophen (TYLENOL PO) Take by mouth as needed.    [name] albuterol  (PROVENTIL HFA;VENTOLIN HFA) 108 (90 Base) MCG/ACT inhaler Inhale 2 puffs into the lungs every 6 (six) hours as needed for wheezing or shortness of breath. 1 Inhaler 3  . Flaxseed, Linseed, (FLAX SEEDS PO) Take by mouth.    [name] FE 1/20 1-20 MG-MCG tablet   1  . ketorolac (TORADOL) 10 MG tablet Take 1 tablet (10 mg total) by mouth every 6 (six) hours as needed. 20 tablet 0  . rizatriptan (MAXALT) 5 MG tablet TAKE 1 TABLET IF NEEDED FOR MIGRAINES MAY REPEAT IN 2 HOURS IF NEEDED 10 tablet 0  . triamcinolone cream (KENALOG) 0.1 % Apply 1 application topically 2 (two) times daily. For 7-10 days maximum 160 g 1   No current facility-administered medications for this visit.      Objective:  Ht 4\' 11"  (1.499 m)   LMP 01/30/2019   BMI 26.61 kg/m  self reported vitals Gen: NAD, resting comfortably Lungs: nonlabored, normal respiratory rate  Skin: appears dry, no obvious rash in areas visible in video (She states bumps under arms and in groin are similar to one pictured below)    Patient uploaded picture of right wrist    Assessment and Plan   # Rash S:Patient states she has been itching in her axilla. This is the 4th day of symptoms.   Happened several months ago and whe was advised topical benadryl- that worked that time but when it has recurred this time has not been nearly as helpful. She is trying not to scratch - armpits, inner thighs, and under bust line. Does have one spot on right wrist and she uploaded a picture. Is  worse at night time.   She is already taking xyzal and oral benadryl has not helped itching either. Notices small red bumps in areas of discomfort. Has not changed deodorants, shampoos, moisturizers. Slight pain under warm water  Mom has had similar issue without the bumps before. No other family members or friends affected  Does not look like hives she has had in past.  A/P: 28 year old female with pruritic papular rash in axilla, groin, under breast line, and one  spot on the right wrist.  With distribution and prudent nature-we discussed scabies as a possibility- no other family members with similar findings-though mother does have some itching.  The fact the patient has had similar rash before seems to point away from this.  Lesions do not appear classic for scabies either.  We opted to trial a steroid cream for the next 7 to 10 days- she may continue Xyzal as well-if no improvement then would consider scabies treatment. -doubt contact dermatitis with distribution -No red flags (see ros listed above) -Since we already discussed scabies as a possibility- would give patient plan for scabies treatment without an additional office visit  Lab/Order associations: Rash  Meds ordered this encounter  Medications  . triamcinolone cream (KENALOG) 0.1 %    Sig: Apply 1 application topically 2 (two) times daily. For 7-10 days maximum    Dispense:  160 g    Refill:  1    Return precautions advised.  Tana ConchStephen Foday Cone, MD

## 2019-03-17 DIAGNOSIS — H35419 Lattice degeneration of retina, unspecified eye: Secondary | ICD-10-CM

## 2019-03-17 HISTORY — DX: Lattice degeneration of retina, unspecified eye: H35.419

## 2019-04-24 ENCOUNTER — Encounter: Payer: Self-pay | Admitting: Physical Therapy

## 2019-06-02 ENCOUNTER — Telehealth: Payer: Self-pay

## 2019-06-02 NOTE — Telephone Encounter (Signed)
May write the following letter-  To whom it may concern,  Kylie Olson is a patient of mine.  Patient has asthma which increases her risk if she were to contract COVID-19.  Ideally she would be allowed to work from home at least until we move into stage III for the state of [place]- please strongly consider allowing her to continue to work from home for the next 5 weeks until [place] the state of COVID-19 in [place].  Thanks so much,  Garret Reddish

## 2019-06-02 NOTE — Telephone Encounter (Signed)
Copied from [place] [id number]. Topic: General - Inquiry >> Jun 02, 2019  1:37 PM Scherrie Gerlach wrote: Reason for CRM: pt would like Dr Yong Channel to write her a note stating ok for her to remain at home and work remotely a little longer. Pt states her office called her today (just 10 min ago) and advised she is to return to work Monday, but her office is right beside the room that test for covid and the quarantine room. Pt states with her health issues, asthma , she needs a note stating ok to stay home a little longer, at least one month. Pt would like you to put on mychart as soon as you can.

## 2019-06-02 NOTE — Telephone Encounter (Signed)
Letter has been placed! Tried to call pt for update but no answer.

## 2019-07-12 ENCOUNTER — Ambulatory Visit: Payer: BC Managed Care – PPO

## 2019-07-17 ENCOUNTER — Ambulatory Visit: Payer: BC Managed Care – PPO

## 2019-07-19 ENCOUNTER — Encounter: Payer: Self-pay | Admitting: Family Medicine

## 2019-07-19 ENCOUNTER — Ambulatory Visit (INDEPENDENT_AMBULATORY_CARE_PROVIDER_SITE_OTHER): Payer: BC Managed Care – PPO

## 2019-07-19 ENCOUNTER — Other Ambulatory Visit: Payer: Self-pay

## 2019-07-19 DIAGNOSIS — Z23 Encounter for immunization: Secondary | ICD-10-CM | POA: Diagnosis not present

## 2019-12-07 ENCOUNTER — Ambulatory Visit: Payer: BC Managed Care – PPO

## 2019-12-08 ENCOUNTER — Ambulatory Visit: Payer: BC Managed Care – PPO

## 2019-12-08 ENCOUNTER — Ambulatory Visit: Payer: BC Managed Care – PPO | Attending: Internal Medicine

## 2019-12-08 DIAGNOSIS — Z23 Encounter for immunization: Secondary | ICD-10-CM

## 2019-12-08 NOTE — Progress Notes (Signed)
   Covid-19 Vaccination Clinic  Name:  Kylie Olson    MRN: [id number] DOB: August 03, 1991  12/08/2019  [name] was observed post Covid-19 immunization for 15 minutes without incidence. She was provided with Vaccine Information Sheet and instruction to access the V-Safe system.   [name] was instructed to call 911 with any severe reactions post vaccine: [name] Difficulty breathing  . Swelling of your face and throat  . A fast heartbeat  . A bad rash all over your body  . Dizziness and weakness    Immunizations Administered    Name Date Dose VIS Date Route   Pfizer COVID-19 Vaccine 12/08/2019 10:59 AM 0.3 mL 10/06/2019 Intramuscular   Manufacturer: [employer], [employer]   Lot: [id number]   NDC: [address]-2

## 2019-12-30 ENCOUNTER — Ambulatory Visit: Payer: BC Managed Care – PPO | Attending: Internal Medicine

## 2019-12-30 DIAGNOSIS — Z23 Encounter for immunization: Secondary | ICD-10-CM | POA: Insufficient documentation

## 2019-12-30 NOTE — Progress Notes (Signed)
   Covid-19 Vaccination Clinic  Name:  Kylie Olson    MRN: [id number] DOB: [birth year]  12/30/2019  [name] was observed post Covid-19 immunization for 15 minutes without incident. She was provided with Vaccine Information Sheet and instruction to access the V-Safe system.   [name] was instructed to call 911 with any severe reactions post vaccine: [name] Difficulty breathing  . Swelling of face and throat  . A fast heartbeat  . A bad rash all over body  . Dizziness and weakness   Immunizations Administered    Name Date Dose VIS Date Route   Pfizer COVID-19 Vaccine 12/30/2019  3:13 PM 0.3 mL 10/06/2019 Intramuscular   Manufacturer: [employer], [employer]   Lot: [id number]   NDC: [address]-6

## 2020-01-08 ENCOUNTER — Other Ambulatory Visit: Payer: Self-pay | Admitting: Obstetrics and Gynecology

## 2020-01-08 DIAGNOSIS — R102 Pelvic and perineal pain: Secondary | ICD-10-CM

## 2020-01-10 ENCOUNTER — Ambulatory Visit
Admission: RE | Admit: 2020-01-10 | Discharge: 2020-01-10 | Disposition: A | Discharge status: Home / Self Care | Payer: BC Managed Care – PPO | Source: Ambulatory Visit | Attending: Obstetrics and Gynecology | Admitting: Obstetrics and Gynecology

## 2020-01-10 DIAGNOSIS — R102 Pelvic and perineal pain: Secondary | ICD-10-CM

## 2020-04-02 ENCOUNTER — Encounter: Payer: Self-pay | Admitting: Family Medicine

## 2020-04-02 ENCOUNTER — Ambulatory Visit (INDEPENDENT_AMBULATORY_CARE_PROVIDER_SITE_OTHER): Payer: BC Managed Care – PPO | Admitting: Family Medicine

## 2020-04-02 DIAGNOSIS — R000 Tachycardia, unspecified: Secondary | ICD-10-CM | POA: Diagnosis not present

## 2020-04-02 DIAGNOSIS — R55 Syncope and collapse: Secondary | ICD-10-CM

## 2020-04-02 NOTE — Progress Notes (Signed)
Virtual Visit via Video Note  I connected with Kylie Olson  on 04/02/20 at  4:00 PM EDT by a video enabled telemedicine application and verified that I am speaking with the correct person using two identifiers.  Location patient: home, [place] Location provider:work or home office Persons participating in the virtual visit: patient, provider  I discussed the limitations of evaluation and management by telemedicine and the availability of in person appointments. The patient expressed understanding and agreed to proceed.   HPI:  Acute visit for prsyncope -started this morning suddenly -this occurred 3x today, she was sitting at rest when each episode occured -symptoms: sudden onset of feeling like she was going to pass out and a sensation of racing heart beat -each episode lasted about 10 minutes and resolved with lying down, putting her feet up and time -denies any similar episodes in the past -denies fevers, malaise, recent illness, cough, cp, sob, headache, vision changes, spinning sensation, nausea, vomiting or any other symptoms except has notice unilateral ankle swelling over the last 1 week -denies new medications, excessive caffeine use, drugs or alcohol, possible dehydration, etc -reports no other health issues recently except that she has been having pelvic pain recently and is seeing ob/gyn for this and might have endometriosis, had mirena placed about 2 months ago  -has a history of anxiety and has had panic attacks in the past, but reports this feels very different -denies a hx of htn or heart issues - though on roc has had elevated BP in the past   ROS: See pertinent positives and negatives per HPI.  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    Past Surgical History:  Procedure Laterality Date   EYE SURGERY  10/26/2008   REFRACTIVE SURGERY     lasik   WISDOM TOOTH EXTRACTION  05/25/2016    Family  History  Problem Relation Age of Onset   Asthma Maternal Grandmother    Cancer Maternal Grandmother        PANCREATIC   Hypertension Maternal Grandmother    Irritable bowel syndrome Mother    Hypertension Mother    Thyroid disease Maternal Uncle    Hypertension Maternal Aunt    Lupus Maternal Aunt    Diabetes Maternal Aunt    Hypertension Maternal Grandfather    Heart disease Maternal Grandfather        quatrupal bypass    Lung cancer Maternal Grandfather 28   Healthy Father    Prostate cancer Paternal Grandfather     SOCIAL HX: see hpi   Current Outpatient Medications:    Acetaminophen (TYLENOL PO), Take by mouth as needed., Disp: , Rfl:    albuterol (PROVENTIL HFA;VENTOLIN HFA) 108 (90 Base) MCG/ACT inhaler, Inhale 2 puffs into the lungs every 6 (six) hours as needed for wheezing or shortness of breath., Disp: 1 Inhaler, Rfl: 3   Flaxseed, Linseed, (FLAX SEEDS PO), Take by mouth., Disp: , Rfl:    JUNEL FE 1/20 1-20 MG-MCG tablet, , Disp: , Rfl: 1   ketorolac (TORADOL) 10 MG tablet, Take 1 tablet (10 mg total) by mouth every 6 (six) hours as needed., Disp: 20 tablet, Rfl: 0   rizatriptan (MAXALT) 5 MG tablet, TAKE 1 TABLET IF NEEDED FOR MIGRAINES MAY REPEAT IN 2 HOURS IF NEEDED, Disp: 10 tablet, Rfl: 0   triamcinolone cream (KENALOG) 0.1 %, Apply 1 application topically 2 (two) times daily. For 7-10 days maximum,  Disp: 160 g, Rfl: 1  EXAM:  VITALS per patient if applicable:  GENERAL: alert, oriented,in no acute distress  HEENT: atraumatic, conjunttiva clear, no obvious abnormalities on inspection of external nose and ears  NECK: normal movements of the head and neck  LUNGS: on inspection no signs of respiratory distress, breathing rate appears normal, no obvious gross SOB, gasping or wheezing  CV: no obvious cyanosis  MS: moves all visible extremities without noticeable abnormality  PSYCH/NEURO: pleasant and cooperative, no obvious depression or  anxiety, speech and thought processing grossly intact  ASSESSMENT AND PLAN:  Discussed the following assessment and plan:  Pre-syncope  Racing heart beat  -we discussed possible serious and likely etiologies, options for evaluation and workup, limitations of telemedicine visit vs in person visit, treatment, treatment risks and precautions. Pt prefers to treat via telemedicine empirically rather then risking or undertaking an in person visit at this moment. However, given presyncope with racing heart she agreed after a lengthy discussion to referral for in-person evaluation. She currently is not having symptoms.  She declined assistance with transportation and agrees to have someone drive her for in-person evaluation at an ucc near her house right away. Declined EMS transport.   I discussed the assessment and treatment plan with the patient. The patient was provided an opportunity to ask questions and all were answered. The patient agreed with the plan and demonstrated an understanding of the instructions.   The patient was advised to call back or seek an in-person evaluation if the symptoms worsen or if the condition fails to improve as anticipated.   Terressa Koyanagi, DO

## 2020-04-03 ENCOUNTER — Ambulatory Visit: Payer: BC Managed Care – PPO | Admitting: Family Medicine

## 2020-06-13 ENCOUNTER — Other Ambulatory Visit: Payer: Self-pay | Admitting: Obstetrics and Gynecology

## 2020-07-02 NOTE — H&P (Signed)
Kylie Olson is a 29 y.o. female, P: 0-0-1-0 who presents for laparoscopy with possible excision of endometriosis because of chronic pelvic pain. Over the past year, this patient has had worsening pelvic pain with and without her menses.  Daily she has a dull pain with random cramping that has no identifiable triggers or aggravating factors. Fortunately she has some relief from this often rated 10/10 pain with Naproxen. During her peri-menstrual period   her pain intensified in spite of being placed on oral contraceptives and the Mirena IUD (both rendered her amenorrheic).  She denies any change in bowel or bladder function, lower back pain or vaginitis symptoms. However, she reports that analgesias (whether Tylenol or Naproxen) causes her constipation. The patient is celibate. A pelvic ultrasound June 2021 revealed an anteverted uterus: [7.0 cm from fundus to external os-37.21 mL]: 4.30 x 3.64 x 4.54 cm, endomterium: 7.74 mm with IUD in place on 3D renderning; right ovary-2.04 cm and left ovary-2.31 cm.  Given the sub-optimal response to various medical therapies, the patient has decided to proceed with laparoscopic evaluation and possible treatment of her pelvic pain. Past Medical History  OB History: G: 1;   P: 0-0-1-0   GYN History: menarche: 29 YO;    LMP: 06/23/2020   Contraception: Mirena IUD;   Denies history of abnormal PAP smear  or sexually transmitted infections.;  Last PAP smear: 2020  Medical History: Asthma and Migraine  Surgical History: 2010 Lasik Eye Surgery Denies problems with anesthesia except post operative nausea and vomiting. Denies any history of blood transfusions  Family History: Thyroid Disease, Lupus, Irritable Bowel Syndrome, Lung Cancer, Hypertension, Pancreatic Cancer, Cardiovascular Disease, Asthma and Glaucoma.  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   ROS: Admits to glasses, dental braces, migraines several times a month, shortness of breath with exercise (uses an inhaler), constipation with "any" pain medications, and environmental agents that can cause transient skin rashes. She  denies headache, vision changes, nasal congestion, dysphagia, tinnitus, dizziness, hoarseness, cough,  chest pain, nausea, vomiting, diarrhea, urinary frequency, urgency  dysuria, hematuria, vaginitis symptoms, swelling of joints,easy bruising,  myalgias, arthralgias, unexplained weight loss and except as is mentioned in the history of present illness, patient's review of systems is otherwise negative.    Physical Exam  Bp: 120/68;  P: 90 bpm.;  R: 16;  Temperature: 97.2 degrees F temporally; Weight: 131 lbs. ;   Height: 4\' 10" ;  BMI: 27.4  Neck: supple without masses or thyromegaly Lungs: clear to auscultation Heart: regular rate and rhythm Abdomen: soft, diffusely tender and no organomegaly Pelvic: (patient declined pre-op pelvic exam) last pelvic exam showed: EGBUS- wnl; vagina-normal rugae; uterus-normal size and tender, cervix without lesions or motion tenderness;-IUD string visible adnexae-no tenderness or masses Extremities:  no clubbing, cyanosis or edema   Assesment:  Chronic Pelvic Pain   Disposition:   A discussion was held with the patient regarding the indication for her procedure(s) along with the risk and benefits to include but not limited to: the robotic approach has less postoperative pain, less blood loss during surgery, reduced risk of injury to other organs due to  better visualization with a 3-D/HD 10 times magnifying camera, shorter hospital stay between 0-1 days and rapid recovery. Although  robot-assisted procedures have a longer operative time than traditional laparotomy, in a patient with good medical history, the benefits usually outweigh the risks. Risks include reaction to anesthesia, excessive bleeding, infection, injury to other organs, need for an open abdominal incision,  and transient post-operative facial edema.  The patient verbalized understanding of these risks and has consented to proceed with Operative Laparoscopy with Possible Excision of Endometriosis at [facility] on July 11, 2020 at 7:30 a.m.   CSN# [id number]   Oney Folz J. Lowell Guitar, PA-C  for Dr. Crist Fat. Rivard

## 2020-07-04 ENCOUNTER — Encounter (HOSPITAL_BASED_OUTPATIENT_CLINIC_OR_DEPARTMENT_OTHER): Payer: Self-pay | Admitting: Obstetrics and Gynecology

## 2020-07-04 ENCOUNTER — Other Ambulatory Visit: Payer: Self-pay

## 2020-07-04 NOTE — Progress Notes (Signed)
Spoke w/ via phone for pre-op interview--- PT Lab needs dos----  Urine preg             Lab results------ pt getting CBC, BMP done 07-08-2020 @ 1400 COVID test ------ 07-08-2020 @ 1500 Arrive at -------  0530 NPO after MN NO Solid Food.  Clear liquids from MN until--- 0430 Medications to take morning of surgery ----- NONE Diabetic medication ----- n/a Patient Special Instructions ----- asked to bring rescue inhaler dos Pre-Op special Istructions ----- n/a Patient verbalized understanding of instructions that were given at this phone interview. Patient denies shortness of breath, chest pain, fever, cough at this phone interview.

## 2020-07-08 ENCOUNTER — Other Ambulatory Visit (HOSPITAL_COMMUNITY)
Admission: RE | Admit: 2020-07-08 | Discharge: 2020-07-08 | Disposition: A | Discharge status: Home / Self Care | Payer: BC Managed Care – PPO | Source: Ambulatory Visit | Attending: Obstetrics and Gynecology | Admitting: Obstetrics and Gynecology

## 2020-07-08 ENCOUNTER — Encounter (HOSPITAL_COMMUNITY)
Admission: RE | Admit: 2020-07-08 | Discharge: 2020-07-08 | Disposition: A | Discharge status: Home / Self Care | Payer: BC Managed Care – PPO | Source: Ambulatory Visit | Attending: Obstetrics and Gynecology | Admitting: Obstetrics and Gynecology

## 2020-07-08 ENCOUNTER — Encounter (HOSPITAL_COMMUNITY): Payer: BC Managed Care – PPO

## 2020-07-08 ENCOUNTER — Other Ambulatory Visit: Payer: Self-pay

## 2020-07-08 DIAGNOSIS — Z01812 Encounter for preprocedural laboratory examination: Secondary | ICD-10-CM | POA: Diagnosis not present

## 2020-07-08 DIAGNOSIS — Z20822 Contact with and (suspected) exposure to covid-19: Secondary | ICD-10-CM | POA: Insufficient documentation

## 2020-07-08 LAB — CBC
HCT: 41.7 % (ref 36.0–46.0)
Hemoglobin: 13.6 g/dL (ref 12.0–15.0)
MCH: 28.2 pg (ref 26.0–34.0)
MCHC: 32.6 g/dL (ref 30.0–36.0)
MCV: 86.5 fL (ref 80.0–100.0)
Platelets: 234 10*3/uL (ref 150–400)
RBC: 4.82 MIL/uL (ref 3.87–5.11)
RDW: 13.6 % (ref 11.5–15.5)
WBC: 9.2 10*3/uL (ref 4.0–10.5)
nRBC: 0 % (ref 0.0–0.2)

## 2020-07-08 LAB — BASIC METABOLIC PANEL
Anion gap: 12 (ref 5–15)
BUN: 10 mg/dL (ref 6–20)
CO2: 25 mmol/L (ref 22–32)
Calcium: 10.1 mg/dL (ref 8.9–10.3)
Chloride: 101 mmol/L (ref 98–111)
Creatinine, Ser: 0.51 mg/dL (ref 0.44–1.00)
GFR calc Af Amer: >60 mL/min (ref >60)
GFR calc non Af Amer: >60 mL/min (ref >60)
Glucose, Bld: 78 mg/dL (ref 70–99)
Potassium: 4 mmol/L (ref 3.5–5.1)
Sodium: 138 mmol/L (ref 135–145)

## 2020-07-08 LAB — SARS CORONAVIRUS 2 (TAT 6-24 HRS): SARS Coronavirus 2: NEGATIVE

## 2020-07-11 ENCOUNTER — Ambulatory Visit (HOSPITAL_BASED_OUTPATIENT_CLINIC_OR_DEPARTMENT_OTHER): Payer: BC Managed Care – PPO | Admitting: Anesthesiology

## 2020-07-11 ENCOUNTER — Encounter (HOSPITAL_BASED_OUTPATIENT_CLINIC_OR_DEPARTMENT_OTHER)
Admission: RE | Disposition: A | Discharge status: Home / Self Care | Payer: Self-pay | Source: Home / Self Care | Attending: Obstetrics and Gynecology

## 2020-07-11 ENCOUNTER — Ambulatory Visit (HOSPITAL_BASED_OUTPATIENT_CLINIC_OR_DEPARTMENT_OTHER)
Admission: RE | Admit: 2020-07-11 | Discharge: 2020-07-11 | Disposition: A | Discharge status: Home / Self Care | Payer: BC Managed Care – PPO | Attending: Obstetrics and Gynecology | Admitting: Obstetrics and Gynecology

## 2020-07-11 ENCOUNTER — Encounter (HOSPITAL_BASED_OUTPATIENT_CLINIC_OR_DEPARTMENT_OTHER): Payer: Self-pay | Admitting: Obstetrics and Gynecology

## 2020-07-11 DIAGNOSIS — J45909 Unspecified asthma, uncomplicated: Secondary | ICD-10-CM | POA: Insufficient documentation

## 2020-07-11 DIAGNOSIS — Z975 Presence of (intrauterine) contraceptive device: Secondary | ICD-10-CM | POA: Diagnosis not present

## 2020-07-11 DIAGNOSIS — G8929 Other chronic pain: Secondary | ICD-10-CM | POA: Diagnosis not present

## 2020-07-11 HISTORY — DX: Family history of other specified conditions: Z84.89

## 2020-07-11 HISTORY — DX: Nausea with vomiting, unspecified: R11.2

## 2020-07-11 HISTORY — DX: Migraine, unspecified, not intractable, without status migrainosus: G43.909

## 2020-07-11 HISTORY — DX: Pelvic and perineal pain: R10.2

## 2020-07-11 HISTORY — PX: CHROMOPERTUBATION: SHX6288

## 2020-07-11 HISTORY — PX: LAPAROSCOPY: SHX197

## 2020-07-11 HISTORY — DX: Unspecified asthma, uncomplicated: J45.909

## 2020-07-11 HISTORY — DX: Other specified postprocedural states: Z98.890

## 2020-07-11 HISTORY — DX: Personal history of other diseases of the circulatory system: Z86.79

## 2020-07-11 HISTORY — DX: Presence of spectacles and contact lenses: Z97.3

## 2020-07-11 LAB — POCT PREGNANCY, URINE: Preg Test, Ur: NEGATIVE

## 2020-07-11 SURGERY — LAPAROSCOPY OPERATIVE
Anesthesia: General | Site: Abdomen

## 2020-07-11 MED ORDER — MIDAZOLAM HCL 5 MG/5ML IJ SOLN
INTRAMUSCULAR | Status: DC | PRN
  Administered 2020-07-11: 2 mg via INTRAVENOUS

## 2020-07-11 MED ORDER — DEXAMETHASONE SODIUM PHOSPHATE 10 MG/ML IJ SOLN
INTRAMUSCULAR | Status: AC
  Filled 2020-07-11: qty 1

## 2020-07-11 MED ORDER — LIDOCAINE 2% (20 MG/ML) 5 ML SYRINGE
INTRAMUSCULAR | Status: AC
  Filled 2020-07-11: qty 5

## 2020-07-11 MED ORDER — ACETAMINOPHEN 10 MG/ML IV SOLN
INTRAVENOUS | Status: DC | PRN
  Administered 2020-07-11: 1000 mg via INTRAVENOUS

## 2020-07-11 MED ORDER — OXYCODONE HCL 5 MG PO TABS
ORAL_TABLET | ORAL | Status: AC
  Filled 2020-07-11: qty 1

## 2020-07-11 MED ORDER — HYDROMORPHONE HCL 1 MG/ML IJ SOLN
0.2500 mg | INTRAMUSCULAR | Status: DC | PRN
  Administered 2020-07-11: 0.5 mg via INTRAVENOUS

## 2020-07-11 MED ORDER — CEFAZOLIN SODIUM-DEXTROSE 2-4 GM/100ML-% IV SOLN
2.0000 g | INTRAVENOUS | Status: AC
  Administered 2020-07-11: 2 g via INTRAVENOUS

## 2020-07-11 MED ORDER — CELECOXIB 200 MG PO CAPS
ORAL_CAPSULE | ORAL | Status: AC
  Filled 2020-07-11: qty 2

## 2020-07-11 MED ORDER — ONDANSETRON HCL 4 MG/2ML IJ SOLN
4.0000 mg | Freq: Once | INTRAMUSCULAR | Status: AC | PRN
  Administered 2020-07-11: 4 mg via INTRAVENOUS

## 2020-07-11 MED ORDER — LIDOCAINE HCL (CARDIAC) PF 100 MG/5ML IV SOSY
PREFILLED_SYRINGE | INTRAVENOUS | Status: DC | PRN
  Administered 2020-07-11: 50 mg via INTRAVENOUS

## 2020-07-11 MED ORDER — PROPOFOL 10 MG/ML IV BOLUS
INTRAVENOUS | Status: DC | PRN
  Administered 2020-07-11: 170 mg via INTRAVENOUS

## 2020-07-11 MED ORDER — SODIUM CHLORIDE 0.9 % IV SOLN
INTRAVENOUS | Status: DC | PRN
  Administered 2020-07-11: 80 mL

## 2020-07-11 MED ORDER — CELECOXIB 200 MG PO CAPS
400.0000 mg | ORAL_CAPSULE | ORAL | Status: AC
  Administered 2020-07-11: 400 mg via ORAL

## 2020-07-11 MED ORDER — CEFAZOLIN SODIUM-DEXTROSE 2-4 GM/100ML-% IV SOLN
INTRAVENOUS | Status: AC
  Filled 2020-07-11: qty 100

## 2020-07-11 MED ORDER — PROMETHAZINE HCL 25 MG/ML IJ SOLN
12.5000 mg | Freq: Once | INTRAMUSCULAR | Status: AC | PRN
  Administered 2020-07-11: 12.5 mg via INTRAVENOUS

## 2020-07-11 MED ORDER — ONDANSETRON HCL 4 MG/2ML IJ SOLN
INTRAMUSCULAR | Status: AC
  Filled 2020-07-11: qty 2

## 2020-07-11 MED ORDER — ONDANSETRON HCL 4 MG/2ML IJ SOLN
INTRAMUSCULAR | Status: DC | PRN
  Administered 2020-07-11: 4 mg via INTRAVENOUS

## 2020-07-11 MED ORDER — GABAPENTIN 300 MG PO CAPS
ORAL_CAPSULE | ORAL | Status: AC
  Filled 2020-07-11: qty 1

## 2020-07-11 MED ORDER — LACTATED RINGERS IV SOLN: INTRAVENOUS | Status: DC

## 2020-07-11 MED ORDER — OXYCODONE HCL 5 MG PO TABS
5.0000 mg | ORAL_TABLET | Freq: Once | ORAL | Status: AC | PRN
  Administered 2020-07-11: 5 mg via ORAL

## 2020-07-11 MED ORDER — ACETAMINOPHEN 500 MG PO TABS: ORAL_TABLET | ORAL | 1 refills | Status: DC

## 2020-07-11 MED ORDER — FENTANYL CITRATE (PF) 100 MCG/2ML IJ SOLN
INTRAMUSCULAR | Status: DC | PRN
Start: 2020-07-11 — End: 2020-07-11
  Administered 2020-07-11: 100 ug via INTRAVENOUS

## 2020-07-11 MED ORDER — LIDOCAINE 2% (20 MG/ML) 5 ML SYRINGE
INTRAMUSCULAR | Status: DC | PRN
  Administered 2020-07-11: 1.284 mg/kg/h via INTRAVENOUS

## 2020-07-11 MED ORDER — PROMETHAZINE HCL 25 MG/ML IJ SOLN: 12.5000 mg | Freq: Once | INTRAMUSCULAR | Status: DC | PRN

## 2020-07-11 MED ORDER — ROCURONIUM BROMIDE 100 MG/10ML IV SOLN
INTRAVENOUS | Status: DC | PRN
  Administered 2020-07-11: 40 mg via INTRAVENOUS

## 2020-07-11 MED ORDER — ACETAMINOPHEN 325 MG PO TABS: 325.0000 mg | ORAL_TABLET | ORAL | Status: DC | PRN

## 2020-07-11 MED ORDER — ACETAMINOPHEN 10 MG/ML IV SOLN
INTRAVENOUS | Status: AC
  Filled 2020-07-11: qty 100

## 2020-07-11 MED ORDER — ENSURE PRE-SURGERY PO LIQD: 296.0000 mL | Freq: Once | ORAL | Status: DC

## 2020-07-11 MED ORDER — OXYCODONE HCL 5 MG/5ML PO SOLN: 5.0000 mg | Freq: Once | ORAL | Status: AC | PRN

## 2020-07-11 MED ORDER — ACETAMINOPHEN 160 MG/5ML PO SOLN: 325.0000 mg | ORAL | Status: DC | PRN

## 2020-07-11 MED ORDER — POVIDONE-IODINE 10 % EX SWAB: 2.0000 "application " | Freq: Once | CUTANEOUS | Status: DC

## 2020-07-11 MED ORDER — FENTANYL CITRATE (PF) 250 MCG/5ML IJ SOLN
INTRAMUSCULAR | Status: AC
  Filled 2020-07-11: qty 5

## 2020-07-11 MED ORDER — DEXAMETHASONE SODIUM PHOSPHATE 4 MG/ML IJ SOLN
INTRAMUSCULAR | Status: DC | PRN
  Administered 2020-07-11: 10 mg via INTRAVENOUS

## 2020-07-11 MED ORDER — PROMETHAZINE HCL 25 MG/ML IJ SOLN
INTRAMUSCULAR | Status: AC
  Filled 2020-07-11: qty 1

## 2020-07-11 MED ORDER — MIDAZOLAM HCL 2 MG/2ML IJ SOLN
INTRAMUSCULAR | Status: AC
  Filled 2020-07-11: qty 2

## 2020-07-11 MED ORDER — SUGAMMADEX SODIUM 200 MG/2ML IV SOLN
INTRAVENOUS | Status: DC | PRN
  Administered 2020-07-11: 150 mg via INTRAVENOUS

## 2020-07-11 MED ORDER — PROPOFOL 10 MG/ML IV BOLUS
INTRAVENOUS | Status: AC
  Filled 2020-07-11: qty 40

## 2020-07-11 MED ORDER — HYDROMORPHONE HCL 1 MG/ML IJ SOLN
INTRAMUSCULAR | Status: AC
  Filled 2020-07-11: qty 1

## 2020-07-11 MED ORDER — SODIUM CHLORIDE 0.9 % IR SOLN
Status: DC | PRN
  Administered 2020-07-11: 3000 mL

## 2020-07-11 MED ORDER — IBUPROFEN 600 MG PO TABS: ORAL_TABLET | ORAL | 1 refills | Status: DC

## 2020-07-11 MED ORDER — SCOPOLAMINE 1 MG/3DAYS TD PT72
MEDICATED_PATCH | TRANSDERMAL | Status: AC
  Filled 2020-07-11: qty 1

## 2020-07-11 MED ORDER — ROCURONIUM BROMIDE 10 MG/ML (PF) SYRINGE
PREFILLED_SYRINGE | INTRAVENOUS | Status: AC
  Filled 2020-07-11: qty 10

## 2020-07-11 MED ORDER — METHYLENE BLUE 0.5 % INJ SOLN
INTRAVENOUS | Status: DC | PRN
  Administered 2020-07-11: 10 mL via SUBMUCOSAL

## 2020-07-11 MED ORDER — SCOPOLAMINE 1 MG/3DAYS TD PT72
1.0000 | MEDICATED_PATCH | TRANSDERMAL | Status: DC
  Administered 2020-07-11: 1.5 mg via TRANSDERMAL

## 2020-07-11 MED ORDER — ARTIFICIAL TEARS OPHTHALMIC OINT
TOPICAL_OINTMENT | OPHTHALMIC | Status: AC
  Filled 2020-07-11: qty 3.5

## 2020-07-11 MED ORDER — ACETAMINOPHEN 500 MG PO TABS
ORAL_TABLET | ORAL | Status: AC
  Filled 2020-07-11: qty 2

## 2020-07-11 MED ORDER — OXYCODONE HCL 5 MG PO TABS: ORAL_TABLET | ORAL | 0 refills | Status: DC

## 2020-07-11 MED ORDER — GABAPENTIN 300 MG PO CAPS
300.0000 mg | ORAL_CAPSULE | ORAL | Status: AC
  Administered 2020-07-11: 300 mg via ORAL

## 2020-07-11 SURGICAL SUPPLY — 103 items
ADH SKN CLS APL DERMABOND .7 (GAUZE/BANDAGES/DRESSINGS) ×2
APL SWBSTK 6 STRL LF DISP (MISCELLANEOUS) ×2
APPLICATOR COTTON TIP 6 STRL (MISCELLANEOUS) ×2 IMPLANT
APPLICATOR COTTON TIP 6IN STRL (MISCELLANEOUS) ×4
BAG SPEC RTRVL LRG 6X4 10 (ENDOMECHANICALS)
BARRIER ADHS 3X4 INTERCEED (GAUZE/BANDAGES/DRESSINGS) IMPLANT
BLADE LAP MORCELLATOR 15MMX9.5 (ELECTROSURGICAL)
BLADE LAP MORCELLATOR 15X9.5 (ELECTROSURGICAL) IMPLANT
BLADE MORCELLATOR EXT  12.5X15 (ELECTROSURGICAL)
BLADE MORCELLATOR EXT 12.5X15 (ELECTROSURGICAL) IMPLANT
BRR ADH 4X3 ABS CNTRL BYND (GAUZE/BANDAGES/DRESSINGS)
CABLE HIGH FREQUENCY MONO STRZ (ELECTRODE) IMPLANT
CANISTER SUCT 3000ML PPV (MISCELLANEOUS) ×4 IMPLANT
CATH FOLEY 3WAY  5CC 16FR (CATHETERS) ×4
CATH FOLEY 3WAY 5CC 16FR (CATHETERS) ×2 IMPLANT
CLOSURE WOUND 1/4X4 (GAUZE/BANDAGES/DRESSINGS) ×2
COVER BACK TABLE 60X90IN (DRAPES) ×8 IMPLANT
COVER TIP SHEARS 8 DVNC (MISCELLANEOUS) IMPLANT
COVER TIP SHEARS 8MM DA VINCI (MISCELLANEOUS)
COVER WAND RF STERILE (DRAPES) ×4 IMPLANT
DECANTER SPIKE VIAL GLASS SM (MISCELLANEOUS) ×8 IMPLANT
DEFOGGER SCOPE WARMER CLEARIFY (MISCELLANEOUS) ×4 IMPLANT
DERMABOND ADVANCED (GAUZE/BANDAGES/DRESSINGS) ×2
DERMABOND ADVANCED .7 DNX12 (GAUZE/BANDAGES/DRESSINGS) ×2 IMPLANT
DILATOR CANAL MILEX (MISCELLANEOUS) IMPLANT
DISSECTOR BLUNT TIP ENDO 5MM (MISCELLANEOUS) IMPLANT
DRAPE ARM DVNC X/XI (DISPOSABLE) IMPLANT
DRAPE COLUMN DVNC XI (DISPOSABLE) IMPLANT
DRAPE DA VINCI XI ARM (DISPOSABLE)
DRAPE DA VINCI XI COLUMN (DISPOSABLE)
DRAPE UTILITY XL STRL (DRAPES) ×4 IMPLANT
DRSG OPSITE POSTOP 3X4 (GAUZE/BANDAGES/DRESSINGS) IMPLANT
DRSG TELFA 3X8 NADH (GAUZE/BANDAGES/DRESSINGS) ×4 IMPLANT
DURAPREP 26ML APPLICATOR (WOUND CARE) ×4 IMPLANT
ELECT REM PT RETURN 9FT ADLT (ELECTROSURGICAL) ×4
ELECTRODE REM PT RTRN 9FT ADLT (ELECTROSURGICAL) ×2 IMPLANT
FORCEPS CUTTING 33CM 5MM (CUTTING FORCEPS) IMPLANT
FORCEPS CUTTING 45CM 5MM (CUTTING FORCEPS) IMPLANT
GAUZE 4X4 16PLY RFD (DISPOSABLE) ×4 IMPLANT
GLOVE BIO SURGEON STRL SZ7 (GLOVE) IMPLANT
GLOVE BIOGEL PI IND STRL 6.5 (GLOVE) ×6 IMPLANT
GLOVE BIOGEL PI IND STRL 7.0 (GLOVE) ×10 IMPLANT
GLOVE BIOGEL PI IND STRL 7.5 (GLOVE) ×4 IMPLANT
GLOVE BIOGEL PI INDICATOR 6.5 (GLOVE) ×6
GLOVE BIOGEL PI INDICATOR 7.0 (GLOVE) ×10
GLOVE BIOGEL PI INDICATOR 7.5 (GLOVE) ×4
GLOVE ECLIPSE 6.5 STRL STRAW (GLOVE) IMPLANT
GLOVE SURG SS PI 6.5 STRL IVOR (GLOVE) ×20 IMPLANT
GOWN STRL REUS W/TWL LRG LVL3 (GOWN DISPOSABLE) ×8 IMPLANT
HIBICLENS CHG 4% 4OZ (MISCELLANEOUS) ×4 IMPLANT
HOLDER FOLEY CATH W/STRAP (MISCELLANEOUS) IMPLANT
IRRIG SUCT STRYKERFLOW 2 WTIP (MISCELLANEOUS) ×4
IRRIGATION SUCT STRKRFLW 2 WTP (MISCELLANEOUS) ×2 IMPLANT
IV NS IRRIG 3000ML ARTHROMATIC (IV SOLUTION) ×4 IMPLANT
LEGGING LITHOTOMY PAIR STRL (DRAPES) ×4 IMPLANT
NEEDLE HYPO 22GX1.5 SAFETY (NEEDLE) ×4 IMPLANT
NEEDLE SPNL 22GX7 QUINCKE BK (NEEDLE) IMPLANT
NS IRRIG 500ML POUR BTL (IV SOLUTION) ×4 IMPLANT
OBTURATOR OPTICAL STANDARD 8MM (TROCAR) ×4
OBTURATOR OPTICAL STND 8 DVNC (TROCAR) ×2
OBTURATOR OPTICALSTD 8 DVNC (TROCAR) ×2 IMPLANT
OCCLUDER COLPOPNEUMO (BALLOONS) ×4 IMPLANT
PACK LAPAROSCOPY BASIN (CUSTOM PROCEDURE TRAY) IMPLANT
PACK ROBOT WH (CUSTOM PROCEDURE TRAY) ×4 IMPLANT
PACK ROBOTIC GOWN (GOWN DISPOSABLE) ×4 IMPLANT
PACK TRENDGUARD 450 HYBRID PRO (MISCELLANEOUS) ×2 IMPLANT
PAD OB MATERNITY 4.3X12.25 (PERSONAL CARE ITEMS) ×4 IMPLANT
PAD PREP 24X48 CUFFED NSTRL (MISCELLANEOUS) ×4 IMPLANT
POUCH LAPAROSCOPIC INSTRUMENT (MISCELLANEOUS) ×4 IMPLANT
POUCH SPECIMEN RETRIEVAL 10MM (ENDOMECHANICALS) IMPLANT
PROTECTOR NERVE ULNAR (MISCELLANEOUS) ×12 IMPLANT
SCISSORS LAP 5X35 DISP (ENDOMECHANICALS) IMPLANT
SEAL CANN UNIV 5-8 DVNC XI (MISCELLANEOUS) ×2 IMPLANT
SEAL XI 5MM-8MM UNIVERSAL (MISCELLANEOUS) ×4
SEALER VESSEL DA VINCI XI (MISCELLANEOUS)
SEALER VESSEL EXT DVNC XI (MISCELLANEOUS) IMPLANT
SET IRRIG Y TYPE TUR BLADDER L (SET/KITS/TRAYS/PACK) IMPLANT
SET SUCTION IRRIG HYDROSURG (IRRIGATION / IRRIGATOR) ×4 IMPLANT
SET TRI-LUMEN FLTR TB AIRSEAL (TUBING) ×4 IMPLANT
SET TUBE SMOKE EVAC HIGH FLOW (TUBING) ×4 IMPLANT
SLEEVE XCEL OPT CAN 5 100 (ENDOMECHANICALS) ×4 IMPLANT
SOL PREP PROV IODINE SCRUB 4OZ (MISCELLANEOUS) IMPLANT
SOLUTION ELECTROLUBE (MISCELLANEOUS) IMPLANT
STRIP CLOSURE SKIN 1/4X4 (GAUZE/BANDAGES/DRESSINGS) ×6 IMPLANT
SUT MNCRL AB 3-0 PS2 27 (SUTURE) ×20 IMPLANT
SUT VIC AB 0 CT1 27 (SUTURE)
SUT VIC AB 0 CT1 27XBRD ANBCTR (SUTURE) IMPLANT
SUT VIC AB 2-0 UR6 27 (SUTURE) IMPLANT
SUT VICRYL 0 UR6 27IN ABS (SUTURE) ×12 IMPLANT
SUT VLOC 180 0 9IN  GS21 (SUTURE)
SUT VLOC 180 0 9IN GS21 (SUTURE) IMPLANT
TIP RUMI ORANGE 6.7MMX12CM (TIP) IMPLANT
TIP UTERINE 5.1X6CM LAV DISP (MISCELLANEOUS) ×4 IMPLANT
TIP UTERINE 6.7X10CM GRN DISP (MISCELLANEOUS) IMPLANT
TIP UTERINE 6.7X6CM WHT DISP (MISCELLANEOUS) IMPLANT
TIP UTERINE 6.7X8CM BLUE DISP (MISCELLANEOUS) IMPLANT
TOWEL OR 17X26 10 PK STRL BLUE (TOWEL DISPOSABLE) ×12 IMPLANT
TRAY FOLEY W/BAG SLVR 14FR LF (SET/KITS/TRAYS/PACK) ×4 IMPLANT
TRENDGUARD 450 HYBRID PRO PACK (MISCELLANEOUS) ×4
TROCAR BALLN 12MMX100 BLUNT (TROCAR) IMPLANT
TROCAR BLADELESS OPT 5 100 (ENDOMECHANICALS) ×4 IMPLANT
TROCAR PORT AIRSEAL 8X120 (TROCAR) IMPLANT
WATER STERILE IRR 1000ML POUR (IV SOLUTION) ×4 IMPLANT

## 2020-07-11 NOTE — Discharge Instructions (Signed)
Call [facility] @ [phone] if:  You have a temperature greater than or equal to 100.4 degrees Farenheit orally You have pain that is not made better by the pain medication given and taken as directed You have excessive bleeding or problems urinating  Take Colace (Docusate Sodium/Stool Softener) 100 mg 2-3 times daily  to avoid constipation or until bowel movements are regular. Take Ibuprofen 600 mg  [first dose 12:10 p.m. today]  and Tylenol 500 mg  (2 tablets) [first dose 2:10 p.m. today] every 6 hours for 5 days then as needed for pain  Your urine will be green for the next 24-48 hours  You may drive after 1 24 hours You may walk up steps  You may shower tomorrow You may resume a regular diet today  Keep incisions clean and dry Do not lift over 15 pounds for 6 weeks Avoid anything in vagina  until after your post-operative visit  DISCHARGE INSTRUCTIONS: Laparoscopy  The following instructions have been prepared to help you care for yourself upon your return home today.  Wound care: [name] Do not get the incision wet for the first 24 hours. The incision should be kept clean and dry. . The Band-Aids or dressings may be removed the day after surgery. . Should the incision become sore, red, and swollen after the first week, check with your doctor.  Personal hygiene: . Shower the day after your procedure.  Activity and limitations: . Do NOT drive or operate any equipment today. . Do NOT lift anything more than 15 pounds for 2-3 weeks after surgery. . Do NOT rest in bed all day. . Walking is encouraged. Walk each day, starting slowly with 5-minute walks 3 or 4 times a day. Slowly increase the length of your walks. . Walk up and down stairs slowly. . Do NOT do strenuous activities, such as golfing, playing tennis, bowling, running, biking, weight lifting, gardening, mowing, or vacuuming for 2-4 weeks. Ask your doctor when it is okay to start.  Diet: Eat a light meal as  desired this evening. You may resume your usual diet tomorrow.  Return to work: This is dependent on the type of work you do. For the most part you can return to a desk job within a week of surgery. If you are more active at work, please discuss this with your doctor.  What to expect after your surgery: You may have a slight burning sensation when you urinate on the first day. You may have a very small amount of blood in the urine. Expect to have a small amount of vaginal discharge/light bleeding for 1-2 weeks. It is not unusual to have abdominal soreness and bruising for up to 2 weeks. You may be tired and need more rest for about 1 week. You may experience shoulder pain for 24-72 hours. Lying flat in bed may relieve it.  Call your doctor for any of the following: . Develop a fever of 100.4 or greater . Inability to urinate 6 hours after discharge from hospital . Severe pain not relieved by pain medications . Persistent of heavy bleeding at incision site . Redness or swelling around incision site after a week . Increasing nausea or vomiting   Post Anesthesia Home Care Instructions  Activity: Get plenty of rest for the remainder of the day. A responsible individual must stay with you for 24 hours following the procedure.  For the next 24 hours, DO NOT: -Drive a car -Advertising copywriter -Drink alcoholic beverages -Take  any medication unless instructed by your physician -Make any legal decisions or sign important papers.  Meals: Start with liquid foods such as gelatin or soup. Progress to regular foods as tolerated. Avoid greasy, spicy, heavy foods. If nausea and/or vomiting occur, drink only clear liquids until the nausea and/or vomiting subsides. Call your physician if vomiting continues.  Special Instructions/Symptoms: Your throat may feel dry or sore from the anesthesia or the breathing tube placed in your throat during surgery. If this causes discomfort, gargle with warm salt water.  The discomfort should disappear within 24 hours.  If you had a scopolamine patch placed behind your ear for the management of post- operative nausea and/or vomiting:  1. The medication in the patch is effective for 72 hours, after which it should be removed.  Wrap patch in a tissue and discard in the trash. Wash hands thoroughly with soap and water. 2. You may remove the patch earlier than 72 hours if you experience unpleasant side effects which may include dry mouth, dizziness or visual disturbances. 3. Avoid touching the patch. Wash your hands with soap and water after contact with the patch.

## 2020-07-11 NOTE — Anesthesia Postprocedure Evaluation (Signed)
Anesthesia Post Note  Patient: Clevie C Krikorian  Procedure(s) Performed: DIAGNOSTIC LAPAROSCOPY, PERITONEL BIOPSY (N/A Abdomen) CHROMOPERTUBATION (N/A Abdomen)     Patient location during evaluation: PACU Anesthesia Type: General Level of consciousness: awake and alert Pain management: pain level controlled Vital Signs Assessment: post-procedure vital signs reviewed and stable Respiratory status: spontaneous breathing, nonlabored ventilation, respiratory function stable and patient connected to nasal cannula oxygen Cardiovascular status: blood pressure returned to baseline and stable Postop Assessment: no apparent nausea or vomiting Anesthetic complications: no Comments: Additional antinausea medications given in PACU   No complications documented.  Last Vitals:  Vitals:   07/11/20 1000 07/11/20 1200  BP: 119/71 124/81  Pulse: (!) 104 88  Resp: 18 15  Temp:  37.1 C  SpO2: 97% 100%    Last Pain:  Vitals:   07/11/20 1200  TempSrc:   PainSc: 3                  Stanislav Gervase P Andrina Locken

## 2020-07-11 NOTE — Interval H&P Note (Signed)
History and Physical Interval Note:  07/11/2020 7:33 AM  Kylie Olson  has presented today for surgery, with the diagnosis of pelvic pain.  The various methods of treatment have been discussed with the patient and family. After consideration of risks, benefits and other options for treatment, the patient has consented to  Procedure(s) with comments: LAPAROSCOPY OPERATIVE (N/A) - possible robotic resection of endometriosis Dr. Estanislado Pandy requesting 2.5 hours OR time POSSIBLE XI ROBOTIC RESECTION OF ENDOMETRIOSIS (N/A) as a surgical intervention.  The patient's history has been reviewed, patient examined, no change in status, stable for surgery.  I have reviewed the patient's chart and labs.  Questions were answered to the patient's satisfaction.     [name]

## 2020-07-11 NOTE — Transfer of Care (Signed)
Immediate Anesthesia Transfer of Care Note  Patient: Kylie Olson  Procedure(s) Performed: DIAGNOSTIC LAPAROSCOPY, PERITONEL BIOPSY (N/A Abdomen) CHROMOPERTUBATION (N/A Abdomen)  Patient Location: PACU  Anesthesia Type:General  Level of Consciousness: drowsy  Airway & Oxygen Therapy: Patient Spontanous Breathing and Patient connected to nasal cannula oxygen  Post-op Assessment: Report given to RN  Post vital signs: Reviewed and stable  Last Vitals:  Vitals Value Taken Time  BP 127/63 07/11/20 0909  Temp    Pulse 116 07/11/20 0911  Resp 22 07/11/20 0911  SpO2 99 % 07/11/20 0911  Vitals shown include unvalidated device data.  Last Pain:  Vitals:   07/11/20 0602  TempSrc: Oral  PainSc: 4       Patients Stated Pain Goal: 4 (07/11/20 0602)  Complications: No complications documented.

## 2020-07-11 NOTE — Anesthesia Procedure Notes (Signed)
Procedure Name: Intubation Date/Time: 07/11/2020 7:46 AM Performed by: Rogers Blocker, CRNA Pre-anesthesia Checklist: Patient identified, Emergency Drugs available, Suction available and Patient being monitored Patient Re-evaluated:Patient Re-evaluated prior to induction Oxygen Delivery Method: Circle System Utilized Preoxygenation: Pre-oxygenation with 100% oxygen Induction Type: IV induction Ventilation: Mask ventilation without difficulty Laryngoscope Size: Mac and 3 Grade View: Grade I Tube type: Oral Tube size: 7.0 mm Number of attempts: 1 Airway Equipment and Method: Stylet Placement Confirmation: ETT inserted through vocal cords under direct vision,  positive ETCO2 and breath sounds checked- equal and bilateral Secured at: 22 cm Tube secured with: Tape Dental Injury: Teeth and Oropharynx as per pre-operative assessment

## 2020-07-11 NOTE — Anesthesia Preprocedure Evaluation (Addendum)
Anesthesia Evaluation  Patient identified by MRN, date of birth, ID band  Reviewed: Patient's Chart, lab work & pertinent test results  History of Anesthesia Complications (+) PONV and Family history of anesthesia reaction  Airway Mallampati: II  TM Distance: >3 FB Neck ROM: Full    Dental  (+) Teeth Intact   Pulmonary asthma ,    Pulmonary exam normal        Cardiovascular negative cardio ROS   Rhythm:Regular Rate:Normal     Neuro/Psych  Headaches, negative psych ROS   GI/Hepatic negative GI ROS, Neg liver ROS,   Endo/Other  negative endocrine ROS  Renal/GU negative Renal ROS  Female GU complaint Chronic pelvic pain with endometriosis    Musculoskeletal negative musculoskeletal ROS (+)   Abdominal Normal abdominal exam  (+)  Abdomen: soft. Bowel sounds: normal.  Peds negative pediatric ROS (+)  Hematology negative hematology ROS (+)   Anesthesia Other Findings   Reproductive/Obstetrics negative OB ROS                            Anesthesia Physical Anesthesia Plan  ASA: II  Anesthesia Plan: General   Post-op Pain Management:    Induction:   PONV Risk Score and Plan: 4 or greater and Ondansetron, Dexamethasone, Scopolamine patch - Pre-op and Propofol infusion  Airway Management Planned: Mask and Oral ETT  Additional Equipment: None  Intra-op Plan:   Post-operative Plan: Extubation in OR  Informed Consent: I have reviewed the patients History and Physical, chart, labs and discussed the procedure including the risks, benefits and alternatives for the proposed anesthesia with the patient or authorized representative who has indicated his/her understanding and acceptance.     Dental advisory given  Plan Discussed with: CRNA  Anesthesia Plan Comments: (Lab Results      Component                Value               Date                      WBC                      9.2                  07/08/2020                HGB                      13.6                07/08/2020                HCT                      41.7                07/08/2020                MCV                      86.5                07/08/2020                PLT  234                 07/08/2020           Covid-19 Nucleic Acid Test Results Lab Results      Component                Value               Date                      SARSCOV2NAA              NEGATIVE            07/08/2020           Lab Results      Component                Value               Date                      PREGTESTUR               NEGATIVE            07/11/2020          )       Anesthesia Quick Evaluation

## 2020-07-11 NOTE — Op Note (Signed)
Preoperative diagnosis: chronic pelvic pain  Postoperative diagnosis: minimal endometriosis and suspected adenomyosis  Anesthesia: General   Anesthesiologist: Dr. Curt Jews  Procedure: Diagnostic laparoscopy, peritoneal biopsies and chromopertubation  Surgeon: Dr. Dois Davenport Amorie Rentz   Assistant: Henreitta Leber P.A.-C .  Estimated blood loss: 5 cc   Procedure:   After being informed of the planned procedure with possible complications including but not limited to bleeding, infection, injury to other organs, need for laparotomy, expected hospital stay and recovery, informed consent is obtained and patient is taken to or #6. She is placed in lithotomy position on Trengard with both arms padded and tucked on each side and bilateral knee-high sequential compressive devices. She is given general anesthesia with endotracheal intubation without any complication. She is prepped and draped in a sterile fashion. A three-way Foley catheter is inserted in her bladder.   Pelvic exam reveals: anteverted uterus, mobile, 2 normal adnexa  A weighted speculum is inserted in the vagina and the anterior lip of the cervix is grasped with a tenaculum forcep. The uterus was then sounded at 7 cm. We easily dilate the cervix using Hegar dilator to #27 which allows for easy placement of the intrauterine RUMI manipulator.  Trocar placement is decided. We infiltrate  the umbilicus with 10 cc of ropivacaine per protocol and perform a 10 mm semi-elliptical incision which is brought down bluntly to the fascia. The fascia is identified and grasped with Coker forceps. The fascia is incised with Mayo scissors. Peritoneum is entered bluntly. A pursestring suture of 0 Vicryl is placed on the fascia and a 10 mm Hassan trocar is easily inserted in the abdominal cavity held in placed with a Purstring suture. This allows for easy insufflation of a pneumoperitoneum using warmed CO2 at a maximum pressure of 15 mm of mercury. 60 cc of  Ropivacaine 0.5 % diluted 1 in 1 is sent in the pelvis and the patient is positioned in reverse Trendelenburg. We then placed one 43mm  trocar in the left lower quadrant.  Observation: Uterus is normal. Both tubes are normal. Anterior  cul-de-sac is normal. Liver is visualized and normal.Gallbladder is normal. Appendix is not visualized. We note the following endometriotic lesions:  1. 1 mm light brown right utero-sacral ligament  2. 1 mm brown lesion on the right colon serosa  3. Diffuse small red lesions posterior cul de sac, cervix and left lower quadrant  4. A peritoneal window at the left uterosacral ligament  5. Increased uterine uptake of methylene blue at chromopertubation compatible with adenomyosis  We sharply biopsy lesion #1 and #3 Chromopertubation confirms bilateral tubal patency  We irrigated profusely to cleanse the pelvis and confirm a satisfactory hemostasis.  All instruments and all trochars are removed under direct visualization after evacuating the pneumoperitoneum.   The fascia of the supraumbilical incision is closed with the previously placed pursestring suture of 0 Vicryl. All incisions are then closed with subcuticular suture of 3-0 Monocryl and Dermabond.   A speculum is inserted in the vagina to confirm adequate hemostasis on the cervix and presence of Mirena IUD strings.  Instrument and sponge count is complete x2. Estimated blood loss is 5 cc.   The procedure is well tolerated by the patient who  is taken to recovery room in a well and stable condition.   Specimen: peritoneal biopsy x 2 sent to pathology

## 2020-07-12 ENCOUNTER — Encounter (HOSPITAL_BASED_OUTPATIENT_CLINIC_OR_DEPARTMENT_OTHER): Payer: Self-pay | Admitting: Obstetrics and Gynecology

## 2020-07-12 LAB — SURGICAL PATHOLOGY

## 2020-08-29 ENCOUNTER — Ambulatory Visit: Payer: BC Managed Care – PPO | Attending: Family

## 2020-08-29 DIAGNOSIS — Z23 Encounter for immunization: Secondary | ICD-10-CM

## 2020-11-12 NOTE — Progress Notes (Signed)
   Covid-19 Vaccination Clinic  Name:  Kylie Olson    MRN: [id number] DOB: 1991-04-24  11/12/2020  [name] was observed post Covid-19 immunization for 15 minutes without incident. She was provided with Vaccine Information Sheet and instruction to access the V-Safe system.   [name] was instructed to call 911 with any severe reactions post vaccine: [name] Difficulty breathing  . Swelling of face and throat  . A fast heartbeat  . A bad rash all over body  . Dizziness and weakness   Immunizations Administered    Name Date Dose VIS Date Route   Pfizer COVID-19 Vaccine 08/29/2020  2:37 PM 0.3 mL 08/14/2020 Intramuscular   Manufacturer: [employer], [employer]   Lot: [id number]   NDC: [address]-9

## 2021-03-11 ENCOUNTER — Ambulatory Visit: Payer: BC Managed Care – PPO | Attending: Family

## 2021-03-11 DIAGNOSIS — Z23 Encounter for immunization: Secondary | ICD-10-CM

## 2021-03-25 ENCOUNTER — Encounter: Payer: Self-pay | Admitting: Family Medicine

## 2021-03-25 ENCOUNTER — Ambulatory Visit: Payer: BC Managed Care – PPO | Admitting: Family Medicine

## 2021-03-25 ENCOUNTER — Other Ambulatory Visit: Payer: Self-pay

## 2021-03-25 VITALS — BP 118/74 | HR 86 | Temp 98.1°F | Ht 59.0 in | Wt 132.0 lb

## 2021-03-25 DIAGNOSIS — G43009 Migraine without aura, not intractable, without status migrainosus: Secondary | ICD-10-CM

## 2021-03-25 DIAGNOSIS — R4589 Other symptoms and signs involving emotional state: Secondary | ICD-10-CM | POA: Diagnosis not present

## 2021-03-25 DIAGNOSIS — N809 Endometriosis, unspecified: Secondary | ICD-10-CM

## 2021-03-25 DIAGNOSIS — G4700 Insomnia, unspecified: Secondary | ICD-10-CM | POA: Diagnosis not present

## 2021-03-25 MED ORDER — RIZATRIPTAN BENZOATE 5 MG PO TABS: ORAL_TABLET | ORAL | 5 refills | Status: DC

## 2021-03-25 NOTE — Patient Instructions (Addendum)
Try a new provider with [facility] since that is covered by your insurance or other treatment location  Try to avoid screen time 30 minutes to an hour before bed  Try to have a consistent bedtime   Could try melatonin prefer 1-3 mg up to 2 weeks  Keep [place] updated on pelvic pain and rash. If pelvic pain does not improve after upcoming discussions- we could consider CT abdomen pelvis   Recommended follow up:  Month mychart update and if you want to set up an annual physical thatd be great too

## 2021-03-25 NOTE — Progress Notes (Signed)
Phone [phone] In person visit   Subjective:   Kylie Olson is a 30 y.o. year old very pleasant female patient who presents for/with See problem oriented charting Chief Complaint  Patient presents with  . Fatigue  . Migraine  . Insomnia   This visit occurred during the SARS-CoV-2 public health emergency.  Safety protocols were in place, including screening questions prior to the visit, additional usage of staff PPE, and extensive cleaning of exam room while observing appropriate contact time as indicated for disinfecting solutions.   Past Medical History-  Patient Active Problem List   Diagnosis Date Noted  . Irritable bowel syndrome 01/12/2017    Priority: Medium  . Migraine without aura and without status migrainosus, not intractable 09/17/2015    Priority: Medium  . Asthma 07/27/2008    Priority: Medium  . Insomnia 12/10/2008    Priority: Low  . 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 acetaminophen (TYLENOL) 500 MG tablet take 2 tablets po every 6 hours (1st dose 2:10 p.m. today) for 5 days then prn-post operative pain 30 tablet 1  . albuterol (VENTOLIN HFA) 108 (90 Base) MCG/ACT inhaler Inhale into the lungs every 6 (six) hours as needed for wheezing or shortness of breath.    . Cholecalciferol (VITAMIN D-3 PO) Take by mouth daily.    . Flaxseed, Linseed, (FLAX SEEDS PO) Take by mouth daily.     [name] ibuprofen (ADVIL) 600 MG tablet take 1 tablet po pc (1st dose 12:10 p.m. today) every 6 hours for 5 days then prn-post operative pain 30 tablet 1  . levocetirizine (XYZAL) 5 MG tablet Take 5 mg by mouth every evening.    [name] levonorgestrel (MIRENA) 20 MCG/24HR IUD 1 each by Intrauterine route once.    . rizatriptan (MAXALT) 5 MG tablet TAKE 1 TABLET IF NEEDED FOR MIGRAINES MAY REPEAT IN 2 HOURS IF NEEDED 10 tablet 5   No current facility-administered medications for this  visit.     Objective:  BP 118/74   Pulse 86   Temp 98.1 F (36.7 C)   Ht 4\' 11"  (1.499 m)   Wt 132 lb (59.9 kg)   BMI 26.66 kg/m  Gen: NAD, resting comfortably CV: RRR no murmurs rubs or gallops Lungs: CTAB no crackles, wheeze, rhonchi Abdomen: soft/nontender except for mild tenderness in suprapubic area/nondistended/normal bowel sounds. No rebound or guarding.  Ext: no edema Skin: warm, dry; papular erythematous rash  On upper chest-patient denies.  Nature     Assessment and Plan  # F/U from Gynecology 07/02/20; Dx endometriosis/possible adenomyosis/fatigue S: Patient presented to GYN for a laparoscopy with possible excision for endometriosis due to chronic pelvic pain. In the past year, pain would worsen with and without her menses. There would be a dull pain/tenderness with sporadic cramping; no identifiable triggers or aggravating factors. Her pain was rated 10/10 but would be relieved with Naproxen 500 mg. Her pain would intensify during peri-menstrual cycles in spite of being placed on oral contraceptives and Mirena IUD placed 03/06/2020 (both rendered her amenorrheic). She denied any changes in bowel and bladder function, lower back pain, or vaginitis symptoms. Reported that analgesics (Tylenol or Naproxen) caused constipation. She is celibate.June 2021- pelvic ultrasound revealed an anteverted uterurs: [ 7.0 cm from fundus to external os-37.21 mL]: 4.30 x 3.64 x 4.54 cm, endometrium: 7.43mm with IUD in place on 3D rendering; right ovary- 2.04 and left ovary  2.31 cm. With the sub-optimal response to various medical therapies given, she decided to proceed with laparoscopic evaluation and possible treatment of her pelvic pain. Diagnoses was found to be Chronic Pelvic Pain-possibly due to minimal endometriosis and/or suspected adenomyosis - her OB/GYN wants to recommend a trial of the Depo shot (possibly on top of the IUD), however, the patient is hesitant due to her weight.  She is not sure  she would want to hormonal therapies at the same time either the other option is a medication that induces menopause at least temporarily per patient. - has done labs but triggered an increase in liver function tests- apparently that was improving after coming off of medication A/P: 30 year old female with endometriosis possible adenomyosis who reports no improvement since being placed on IUD.  She feels like she has other side effects since that time including some muscle aches, feeling more down, rash on upper chest.  Neck steps were to consider Depo-Medrol injections versus another treatment patient does not know the specific name of- I encouraged patient to follow-up with gynecology to discuss neck steps but also to express her concern about IUD and potential of it being removed since she has not noted improvement. -We also discussed if no improvement she could reach out to me and we could consider CT abdomen pelvis- I think most likely this will be low yield but would rule out something like diverticulitis-would be rare to have something that chronic but with nearly 8 months of symptoms I think would be reasonable if not improving.  We also discussed contrast shortage but that if we needed to get this done we can certainly push for it -Fatigue issues could be due to poor sleep as she reports she did have lab work with GYN including CBC, CMP, TSH it sounds like-she does not want to repeat labs at this time  #Migraines without aura S: Patient previously prescribed rizatriptan 5 mg if needed-has not recently been taking- denies any side effects- alternating between Tylenol and Ibuprofen.  Only getting about 61-month A/P: Doing well overall recently-I did refill rizatriptan for sparing use-still only having 94-month.  Thankfully no aura.  # Chest rash- has dermatology appointment scheduled for 03/26/21 for a noticeable discoloration- it is not irritable, itching, or breaking out.  She noticed this started  after IUD was placed-see discussion above  #Depressed mood/Counseling/insomnia S: she is [facility] [place] A/P: Mild elevation in PHQ-9 at 8 today- discussed possible medication treatment but she would like to avoid this.  She is interested in finding another therapist-discussedTry a new provider with the [facility] since it is covered by your insurance or other treatment facilities  -She really feels like her depressed mood is from poor sleep-feels like this also produces her fatigue.  She does have a TV in her bedroom and recommended removing this.  Also encouraged her to avoid screen time 30 minutes before bed at least.  Discussed having consistent bedtime and could try melatonin 1-3 mg up to 2 weeks -Encouraged her to follow-up if failing to improve with these adjustments  Recommended follow up:No follow-ups on file. No future appointments.  Lab/Order associations: No diagnosis found.  Meds ordered this encounter  Medications  . rizatriptan (MAXALT) 5 MG tablet    Sig: TAKE 1 TABLET IF NEEDED FOR MIGRAINES MAY REPEAT IN 2 HOURS IF NEEDED    Dispense:  10 tablet    Refill:  5  I,Harris Phan,acting as a Neurosurgeon for [name], MD.,have documented all relevant documentation on the behalf of [name], MD,as directed by  [name], MD while in the presence of [name], MD.    I, [name], MD, have reviewed all documentation for this visit. The documentation on 03/25/21 for the exam, diagnosis, procedures, and orders are all accurate and complete.   Return precautions advised.  [name], MD

## 2021-04-07 NOTE — Progress Notes (Signed)
   Covid-19 Vaccination Clinic  Name:  Kylie Olson    MRN: [id number] DOB: September 08, 1991  04/07/2021  [name] was observed post Covid-19 immunization for 15 minutes without incident. She was provided with Vaccine Information Sheet and instruction to access the V-Safe system.   [name] was instructed to call 911 with any severe reactions post vaccine: Difficulty breathing  Swelling of face and throat  A fast heartbeat  A bad rash all over body  Dizziness and weakness   Immunizations Administered     Name Date Dose VIS Date Route   Pfizer COVID-19 Vaccine 03/11/2021  3:56 PM 0.3 mL 08/14/2020 Intramuscular   Manufacturer: [employer], [employer]   Lot: [id number]   NDC: [address]-0

## 2021-04-08 ENCOUNTER — Encounter: Payer: Self-pay | Admitting: Family Medicine

## 2021-09-05 ENCOUNTER — Ambulatory Visit: Payer: BC Managed Care – PPO | Attending: Family

## 2021-09-05 DIAGNOSIS — Z23 Encounter for immunization: Secondary | ICD-10-CM

## 2021-09-05 NOTE — Progress Notes (Signed)
   Covid-19 Vaccination Clinic  Name:  Kylie Olson    MRN: [id number] DOB: Apr 07, 1991  09/05/2021  [name] was observed post Covid-19 immunization for 15 minutes without incident. She was provided with Vaccine Information Sheet and instruction to access the V-Safe system.   [name] was instructed to call 911 with any severe reactions post vaccine: Difficulty breathing  Swelling of face and throat  A fast heartbeat  A bad rash all over body  Dizziness and weakness   Immunizations Administered     Name Date Dose VIS Date Route   Pfizer Covid-19 Vaccine Bivalent Booster 09/05/2021 10:42 AM 0.3 mL 06/25/2021 Intramuscular   Manufacturer: [employer], [employer]   Lot: [id number]   NDC: [id number]

## 2021-11-12 ENCOUNTER — Encounter: Payer: Self-pay | Admitting: Family Medicine

## 2021-11-14 ENCOUNTER — Other Ambulatory Visit: Payer: Self-pay

## 2021-11-14 ENCOUNTER — Encounter: Payer: Self-pay | Admitting: Family Medicine

## 2021-11-14 ENCOUNTER — Telehealth (INDEPENDENT_AMBULATORY_CARE_PROVIDER_SITE_OTHER): Payer: BC Managed Care – PPO | Admitting: Family Medicine

## 2021-11-14 ENCOUNTER — Ambulatory Visit: Payer: BC Managed Care – PPO | Admitting: Family Medicine

## 2021-11-14 DIAGNOSIS — G43009 Migraine without aura, not intractable, without status migrainosus: Secondary | ICD-10-CM

## 2021-11-14 DIAGNOSIS — N809 Endometriosis, unspecified: Secondary | ICD-10-CM | POA: Diagnosis not present

## 2021-11-14 NOTE — Progress Notes (Signed)
Phone [phone] Virtual visit via Video note   Subjective:  Chief complaint: Chief Complaint  Patient presents with   Endometriosis   This visit type was conducted due to national recommendations for restrictions regarding the COVID-19 Pandemic (e.g. social distancing).  This format is felt to be most appropriate for this patient at this time balancing risks to patient and risks to population by having him in for in person visit.  No physical exam was performed (except for noted visual exam or audio findings with Telehealth visits).    Our team/I connected with Kylie Olson at  9:20 AM EST by a video enabled telemedicine application (doxy.me or caregility through epic) and verified that I am speaking with the correct person using two identifiers.  Location patient: [facility] Location provider: [facility], office Persons participating in the virtual visit:  patient  Our team/I discussed the limitations of evaluation and management by telemedicine and the availability of in person appointments. In light of current covid-19 pandemic, patient also understands that we are trying to protect them by minimizing in office contact if at all possible.  The patient expressed consent for telemedicine visit and agreed to proceed. Patient understands insurance will be billed.   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 today) for 5 days then prn-post operative pain 30 tablet 1   albuterol  (VENTOLIN HFA) 108 (90 Base) MCG/ACT inhaler Inhale into the lungs every 6 (six) hours as needed for wheezing or shortness of breath.     Cholecalciferol (VITAMIN D-3 PO) Take by mouth daily.     Flaxseed, Linseed, (FLAX SEEDS PO) Take by mouth daily.      ibuprofen (ADVIL) 600 MG tablet take 1 tablet po pc (1st dose 12:10 p.m. today) every 6 hours for 5 days then prn-post operative pain 30 tablet 1   levocetirizine (XYZAL) 5 MG tablet Take 5 mg by mouth every evening.     levonorgestrel (MIRENA) 20 MCG/24HR IUD 1 each by Intrauterine route once.     rizatriptan (MAXALT) 5 MG tablet TAKE 1 TABLET IF NEEDED FOR MIGRAINES MAY REPEAT IN 2 HOURS IF NEEDED 10 tablet 5   No current facility-administered medications for this visit.     Objective:  No self reported vitals Gen: NAD, resting comfortably Lungs: nonlabored, normal respiratory rate  Skin: appears dry, no obvious rash    Assessment and Plan   # Endometriosis-with continued significant pain S:had exploratory surgery where this was diagnosed. Dewayne Hatch raised her liver enzymes. Depo Lupron injections may be considered. Upcoming appointment on Tuesday and she is not sure what to do.  -Last year had tried nsaids, depo medrol, now has IUD. Has tried IUD plus oral birth control.  -concerned about sleep, weight gain, ability to become pregnant  A/P: We had a good discussion today about potential benefits and risks (such as potential for migraines to worsen, risk of depressed mood, risk of weight gain-though it appears likely  to long-term suppress ability to get pregnant when she stops medication).  Patient continues to have significant pain from endometriosis and has trialed a variety of therapies but I would typically recommend before proceeding with Lupron.  I encouraged her to double check on potential benefits and risks with her prescribing GYN directly as well and she plans to do so before injection next week  Recommended follow up: As  needed for new or worsening symptoms  Lab/Order associations:   ICD-10-CM   1. Endometriosis  N80.9     2. Migraine without aura and without status migrainosus, not intractable  G43.009      Time Spent: 21 minutes of total time (5:28 PM- 5:49 PM) was spent on the date of the encounter performing the following actions: chart review prior to seeing the patient, obtaining history,  counseling on the treatment plan and patient concerns about the plan, and documenting in our EHR.   Return precautions advised.  [name], MD

## 2021-11-14 NOTE — Progress Notes (Signed)
Visit rescheduled to video later in day

## 2022-05-07 ENCOUNTER — Telehealth (INDEPENDENT_AMBULATORY_CARE_PROVIDER_SITE_OTHER): Payer: BC Managed Care – PPO | Admitting: Physician Assistant

## 2022-05-07 ENCOUNTER — Encounter: Payer: Self-pay | Admitting: Physician Assistant

## 2022-05-07 VITALS — Ht 59.0 in | Wt 135.0 lb

## 2022-05-07 DIAGNOSIS — U071 COVID-19: Secondary | ICD-10-CM

## 2022-05-07 MED ORDER — NIRMATRELVIR/RITONAVIR (PAXLOVID)TABLET: 3.0000 | ORAL_TABLET | Freq: Two times a day (BID) | ORAL | 0 refills | Status: AC

## 2022-05-07 NOTE — Patient Instructions (Signed)
Please reach out with updates / concerns. Feel better!

## 2022-05-07 NOTE — Progress Notes (Signed)
   Virtual Visit via Video Note  I connected with  Kylie Olson  on 05/07/22 at 11:30 AM EDT by a video enabled telemedicine application and verified that I am speaking with the correct person using two identifiers.  Location: Patient: home Provider: Nature conservation officer at [employer] Persons present: Patient and myself   I discussed the limitations of evaluation and management by telemedicine and the availability of in person appointments. The patient expressed understanding and agreed to proceed.   History of Present Illness:  Chief complaint: COVID-19 positive home test this morning Symptom onset: Yesterday 05/06/22 - hot/cold spells Pertinent positives: Night sweats, occasional cough, slight congestion, fatigue Pertinent negatives: SOB, CP, fever, ST, n/v/d, abd pain Vaccine status: UTD on all COVID vaccines Sick exposure: Mother tested positive this morning as well  IUD in place per patient.     Observations/Objective:   Gen: Awake, alert, no acute distress, lying down in her bed Resp: Breathing is even and non-labored Psych: calm/pleasant demeanor Neuro: Alert and Oriented x 3, + facial symmetry, speech is clear.   Assessment and Plan:  1. COVID-19 Diagnosis confirmed via home antigen test.  Patient is within 5 day window of symptom onset, could benefit from antiviral.  Risks versus benefits discussed. Agreed to start Paxlovid, possible SE discussed.  Advised self-isolation at home for the next 5 days and then masking around others for at least an additional 5 days.  Treat supportively at this time including sleeping prone, deep breathing exercises, pushing fluids, walking every few hours, vitamins C and D, and Tylenol or ibuprofen as needed.  The patient understands that COVID-19 illness can wax and wane.  Should the symptoms acutely worsen or patient starts to experience sudden shortness of breath, chest pain, severe weakness, the patient will go straight to the  emergency department.  Also advised home pulse oximetry monitoring and for any reading consistently under 92%, should also report to the emergency department.  The patient will continue to keep [place] updated.   Follow Up Instructions:    I discussed the assessment and treatment plan with the patient. The patient was provided an opportunity to ask questions and all were answered. The patient agreed with the plan and demonstrated an understanding of the instructions.   The patient was advised to call back or seek an in-person evaluation if the symptoms worsen or if the condition fails to improve as anticipated.  Mazzy Santarelli M Nakita Santerre, PA-C

## 2022-05-18 ENCOUNTER — Telehealth: Payer: Self-pay | Admitting: Family Medicine

## 2022-05-18 NOTE — Telephone Encounter (Signed)
Called and spoke with pt and below message given. 

## 2022-05-18 NOTE — Telephone Encounter (Signed)
Patient tested COVID Positive 05/06/22 and was prescribed Paxlovid following visit with Alyssa Allwardt on 05/07/22. States she finished medication and has no symptoms, but is still testing positive. Patient requests a call back in regards to this. Please Advise.

## 2022-05-18 NOTE — Telephone Encounter (Signed)
FYI

## 2022-07-20 ENCOUNTER — Encounter: Payer: Self-pay | Admitting: *Deleted

## 2022-07-30 ENCOUNTER — Encounter: Payer: Self-pay | Admitting: Family Medicine

## 2022-09-24 ENCOUNTER — Encounter: Payer: Self-pay | Admitting: Family Medicine

## 2022-10-08 ENCOUNTER — Encounter: Payer: Self-pay | Admitting: *Deleted

## 2023-02-01 ENCOUNTER — Other Ambulatory Visit: Payer: Self-pay | Admitting: Family

## 2023-02-01 MED ORDER — PREDNISONE 20 MG PO TABS: 40.0000 mg | ORAL_TABLET | Freq: Every day | ORAL | 0 refills | Status: DC

## 2024-04-24 ENCOUNTER — Ambulatory Visit: Payer: Self-pay

## 2024-04-24 NOTE — Telephone Encounter (Signed)
 FYI Only or Action Required?: FYI only for provider.  Patient was last seen in primary care on 11/14/2021 by Katrinka Garnette KIDD, MD. Called Nurse Triage reporting Fatigue. Symptoms began several weeks ago. Interventions attempted: Rest, hydration, or home remedies. Symptoms are: unchanged.  Triage Disposition: See PCP When Office is Open (Within 3 Days)  Patient/caregiver understands and will follow disposition?: Yes   Copied from CRM [id number]. Topic: Clinical - Red Word Triage >> Apr 24, 2024  9:00 AM Ernestene P wrote: [facility] that prompted transfer to Nurse Triage: weakness, no energy   ----------------------------------------------------------------------- From previous Reason for Contact - Scheduling: Patient/patient representative is calling to schedule an appointment. Refer to attachments for appointment information. Reason for Disposition  [1] MILD weakness (i.e., does not interfere with ability to work, go to school, normal activities) AND [2] persists > 1 week  Answer Assessment - Initial Assessment Questions 1. DESCRIPTION: Describe how you are feeling.     Tired, weak, low energy 2. SEVERITY: How bad is it?  Can you stand and walk?   - MILD (0-3): Feels weak or tired, but does not interfere with work, school or normal activities.   - MODERATE (4-7): Able to stand and walk; weakness interferes with work, school, or normal activities.   - SEVERE (8-10): Unable to stand or walk; unable to do usual activities.     Mild but a struggle 3. ONSET: When did these symptoms begin? (e.g., hours, days, weeks, months)     couple 4. CAUSE: What do you think is causing the weakness or fatigue? (e.g., not drinking enough fluids, medical problem, trouble sleeping)     unsure 5. NEW MEDICINES:  Have you started on any new medicines recently? (e.g., opioid pain medicines, benzodiazepines, muscle relaxants, antidepressants, antihistamines, neuroleptics, beta blockers)     denies 6.  OTHER SYMPTOMS: Do you have any other symptoms? (e.g., chest pain, fever, cough, SOB, vomiting, diarrhea, bleeding, other areas of pain)     denies  Protocols used: Weakness (Generalized) and Fatigue-A-AH

## 2024-04-24 NOTE — Telephone Encounter (Signed)
 Appt tomorrow.

## 2024-04-25 ENCOUNTER — Encounter: Payer: Self-pay | Admitting: Family

## 2024-04-25 ENCOUNTER — Ambulatory Visit: Admitting: Family

## 2024-04-25 VITALS — BP 117/76 | HR 66 | Wt 144.4 lb

## 2024-04-25 DIAGNOSIS — R5383 Other fatigue: Secondary | ICD-10-CM | POA: Diagnosis not present

## 2024-04-25 DIAGNOSIS — R7303 Prediabetes: Secondary | ICD-10-CM | POA: Diagnosis not present

## 2024-04-25 LAB — CBC WITH DIFFERENTIAL/PLATELET
Basophils Absolute: 0 10*3/uL (ref 0.0–0.1)
Basophils Relative: 0.7 % (ref 0.0–3.0)
Eosinophils Absolute: 0.1 10*3/uL (ref 0.0–0.7)
Eosinophils Relative: 1.2 % (ref 0.0–5.0)
HCT: 41.4 % (ref 36.0–46.0)
Hemoglobin: 13.6 g/dL (ref 12.0–15.0)
Lymphocytes Relative: 30.3 % (ref 12.0–46.0)
Lymphs Abs: 2.1 10*3/uL (ref 0.7–4.0)
MCHC: 32.8 g/dL (ref 30.0–36.0)
MCV: 86.6 fl (ref 78.0–100.0)
Monocytes Absolute: 0.5 10*3/uL (ref 0.1–1.0)
Monocytes Relative: 6.7 % (ref 3.0–12.0)
Neutro Abs: 4.2 10*3/uL (ref 1.4–7.7)
Neutrophils Relative %: 61.1 % (ref 43.0–77.0)
Platelets: 250 10*3/uL (ref 150.0–400.0)
RBC: 4.77 Mil/uL (ref 3.87–5.11)
RDW: 14.1 % (ref 11.5–15.5)
WBC: 6.9 10*3/uL (ref 4.0–10.5)

## 2024-04-25 LAB — VITAMIN D 25 HYDROXY (VIT D DEFICIENCY, FRACTURES): VITD: 25.78 ng/mL — ABNORMAL LOW (ref 30.00–100.00)

## 2024-04-25 LAB — HEMOGLOBIN A1C: Hgb A1c MFr Bld: 6.1 % (ref 4.6–6.5)

## 2024-04-25 LAB — TSH: TSH: 0.97 u[IU]/mL (ref 0.35–5.50)

## 2024-04-25 NOTE — Patient Instructions (Addendum)
 It was very nice to see you today!   I will review your lab results via MyChart in a few days.  See the handout attached for tips on fighting fatigue similar to what we discussed. Can try taking a Vitamin B complex vitamin which includes all the B vitamins which are considered our energy vitamins. Be sure you are hydrating daily with at least 2 liters of water or non-caffeinated beverages. Get back into an exercise routine at least 4-5 days per week and exercise at least 3 hours before bedtime.       PLEASE NOTE:  If you had any lab tests please let us  know if you have not heard back within a few days. You may see your results on MyChart before we have a chance to review them but we will give you a call once they are reviewed by us . If we ordered any referrals today, please let us  know if you have not heard from their office within the next week.

## 2024-04-25 NOTE — Progress Notes (Signed)
 Patient ID: Kylie Olson, female    DOB: 16-Mar-1991, 33 y.o.   MRN: [id number]  Chief Complaint  Patient presents with   Fatigue    Onset 1 month  Discussed the use of AI scribe software for clinical note transcription with the patient, who gave verbal consent to proceed.  History of Present Illness Kylie Olson is a 33 year old female who presents with fatigue and pelvic pain.  Fatigue and low energy - Significant fatigue and low energy for the past month - Fatigue persists despite adequate sleep and rest - No nocturnal awakenings - Exercise routine is inconsistent due to fatigue, with treadmill workouts when able - No depression, but some unmotivated days  Pelvic pain and abnormal uterine bleeding - Pelvic pain and spotting for the past 1-2 weeks - Spotting unusual with Mirena IUD, as she typically does not have menstrual cycles - History of ovarian cysts - No history of uterine fibroids - Scheduled to follow up with OB GYN at the end of the month  Metabolic and endocrine findings - Previously informed of prediabetes earlier this year with A1c ranging from 5.6 to 6 - No personal history of thyroid disease or autoimmune conditions - Weight has fluctuated - Has experienced higher blood pressure readings, possibly related to stress  Lifestyle and diet - Works at the [employer] center - Lives alone - Drinks at least two liters of water daily   Assessment & Plan Fatigue Fatigue for one month. Family history of lupus and rheumatoid arthritis. Discussed vitamin B complex benefits. - Order CBC, thyroid function tests, vitamin D level, and ANA test. - Recommend vitamin B complex supplementation. - Advise consistent sleep schedule and sleep hygiene. - Encourage regular exercise.  Prediabetes Previously identified prediabetic with A1c 5.6-6. Discussed low-dose metformin for weight management and glucose control. Emphasized low-carb diet and avoiding high-sugar foods. -  Order A1c test. - Discuss low-dose metformin if A1c elevated. - Advise low-carb diet and limit sugar intake.  Pelvic Pain Ongoing pelvic pain managed by OB GYN. Ovarian cysts present, no fibroids. Recent spotting despite Mirena IUD. - Continue follow-up with OB GYN.  General Health Maintenance Discussed lifestyle modifications for health and energy. Emphasized diet, exercise, and sleep hygiene. - Advise balanced diet with lean proteins, whole grains, and vegetables. - Encourage regular physical activity. - Recommend cool, dark, quiet sleep environment.      Subjective:    Outpatient Medications Prior to Visit  Medication Sig Dispense Refill   albuterol  (VENTOLIN  HFA) 108 (90 Base) MCG/ACT inhaler Inhale into the lungs every 6 (six) hours as needed for wheezing or shortness of breath.     Cholecalciferol (VITAMIN D-3 PO) Take by mouth daily.     Flaxseed, Linseed, (FLAX SEEDS PO) Take by mouth daily.      levocetirizine (XYZAL) 5 MG tablet Take 5 mg by mouth every evening.     levonorgestrel (MIRENA) 20 MCG/24HR IUD 1 each by Intrauterine route once.     predniSONE  (DELTASONE ) 20 MG tablet Take 2 tablets (40 mg total) by mouth daily with breakfast. 10 tablet 0   rizatriptan  (MAXALT ) 5 MG tablet TAKE 1 TABLET IF NEEDED FOR MIGRAINES MAY REPEAT IN 2 HOURS IF NEEDED 10 tablet 5   acetaminophen  (TYLENOL ) 500 MG tablet take 2 tablets po every 6 hours (1st dose 2:10 p.m. today) for 5 days then prn-post operative pain 30 tablet 1   ibuprofen  (ADVIL ) 600 MG tablet take 1 tablet po pc (1st  dose 12:10 p.m. today) every 6 hours for 5 days then prn-post operative pain 30 tablet 1   No facility-administered medications prior to visit.   Past Medical History:  Diagnosis Date   Asthma followed by pcp   per pt exercised induce   (07-04-2020  pt stated last used rescue inhaler approx. 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   Migraines    Pelvic pain in female    PONV (postoperative nausea and vomiting)    Wears contact lenses    Past Surgical History:  Procedure Laterality Date   CHROMOPERTUBATION N/A 07/11/2020   Procedure: CHROMOPERTUBATION;  Surgeon: Darcel Pool, MD;  Location: [facility];  Service: Gynecology;  Laterality: N/A;   LAPAROSCOPY N/A 07/11/2020   Procedure: DIAGNOSTIC LAPAROSCOPY, PERITONEL BIOPSY;  Surgeon: Darcel Pool, MD;  Location: [facility];  Service: Gynecology;  Laterality: N/A;   NO PAST SURGERIES     WISDOM TOOTH EXTRACTION  05/25/2016   Allergies  Allergen Reactions   Mushroom Extract Complex (Obsolete) Itching    severe   Latex Rash      Objective:    Physical Exam Vitals and nursing note reviewed.  Constitutional:      Appearance: Normal appearance.   Cardiovascular:     Rate and Rhythm: Normal rate and regular rhythm.  Pulmonary:     Effort: Pulmonary effort is normal.     Breath sounds: Normal breath sounds.   Musculoskeletal:        General: Normal range of motion.   Skin:    General: Skin is warm and dry.   Neurological:     Mental Status: She is alert.   Psychiatric:        Mood and Affect: Mood normal.        Behavior: Behavior normal.   BP 117/76   Pulse 66   Wt 144 lb 6.4 oz (65.5 kg)   SpO2 100%   BMI 29.17 kg/m  Wt Readings from Last 3 Encounters:  04/25/24 144 lb 6.4 oz (65.5 kg)  05/07/22 135 lb (61.2 kg)  11/14/21 135 lb (61.2 kg)      Lucius Krabbe, NP

## 2024-04-27 ENCOUNTER — Ambulatory Visit: Payer: Self-pay | Admitting: Family

## 2024-04-27 DIAGNOSIS — R5383 Other fatigue: Secondary | ICD-10-CM

## 2024-04-27 DIAGNOSIS — R768 Other specified abnormal immunological findings in serum: Secondary | ICD-10-CM

## 2024-04-27 LAB — ANA: Anti Nuclear Antibody (ANA): POSITIVE — AB

## 2024-04-27 LAB — ANTI-NUCLEAR AB-TITER (ANA TITER): ANA Titer 1: 1:320 {titer} — ABNORMAL HIGH

## 2024-08-07 ENCOUNTER — Encounter: Payer: Self-pay | Admitting: Family Medicine

## 2024-08-15 ENCOUNTER — Ambulatory Visit: Admitting: Family Medicine

## 2024-10-31 NOTE — Progress Notes (Signed)
 "  Office Visit Note  Patient: Kylie Olson             Date of Birth: 1991-10-01           MRN: [id number]             PCP: Katrinka Garnette KIDD, MD Referring: Lucius Krabbe, NP Visit Date: 11/14/2024 Occupation: Data Unavailable  Subjective:  Fatigue, positive ANA  History of Present Illness: Kylie Olson is a 34 y.o. female seen for the evaluation of positive ANA and fatigue.  According the patient her symptoms started with migraines when she was in college.  She states she was treated by her PCP and a neurologist and her migraines are under good control.  In 2020 she started experiencing pelvic pain and cramps.  She was also experiencing heavy periods.  She was seen by her GYN and had laparoscopic examination which was negative for endometriosis.  She was on oral contraceptive pills followed by IUD which helped menorrhagia.  She continues to have chronic pain and followed by her GYN.  She states for the last 20 years she has been experiencing insomnia and fatigue.  She has also had history of chronic constipation for many years for which she has been diagnosed with IBS by her PCP.  She was recently diagnosed with vitamin D  deficiency and was given vitamin D  supplement.  She denies any joint pain or muscle pain.  She states due to ongoing fatigue her labs were performed and her ANA was positive.  She denies any history of oral ulcers, nasal ulcers, sicca symptoms, malar rash, photosensitivity, Raynaud's, lymphadenopathy or inflammatory arthritis. She is left-handed, she works as a theatre manager for [employer].  She enjoys walking.  She is single, gravida 1, abortion 1.  There is no history of DVTs.  She has never been a smoker.  She drinks alcohol only occasionally. Family history is positive for lupus in maternal aunt and undifferentiated connective tissue disease in her mother.    Activities of Daily Living:  Patient reports morning stiffness for 5-6 minutes.   Patient  Reports nocturnal pain.  Difficulty dressing/grooming: Denies Difficulty climbing stairs: Denies Difficulty getting out of chair: Denies Difficulty using hands for taps, buttons, cutlery, and/or writing: Denies  Review of Systems  Constitutional:  Positive for fatigue.  HENT:  Negative for mouth sores and mouth dryness.   Eyes:  Negative for dryness.  Respiratory:  Negative for shortness of breath.   Cardiovascular:  Negative for chest pain and palpitations.  Gastrointestinal:  Negative for blood in stool, constipation and diarrhea.  Endocrine: Negative for increased urination.  Genitourinary:  Negative for involuntary urination.  Musculoskeletal:  Positive for morning stiffness. Negative for joint pain, gait problem, joint pain, joint swelling, myalgias, muscle weakness, muscle tenderness and myalgias.  Skin:  Negative for color change, rash, hair loss and sensitivity to sunlight.  Allergic/Immunologic: Negative for susceptible to infections.  Neurological:  Negative for dizziness and headaches.  Hematological:  Negative for swollen glands.  Psychiatric/Behavioral:  Positive for sleep disturbance. Negative for depressed mood. The patient is nervous/anxious.     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   Migraines    Pelvic pain in female    PONV (postoperative nausea and vomiting)    Wears contact lenses     Family History  Problem  Relation Age of Onset   Diabetes Mother    Irritable bowel syndrome Mother    Hypertension Mother    Heart disease Mother        heart surgery at age 54   Other Father        Subarachnoid hemorrhage   Diabetes Father    Hypertension Maternal Aunt    Lupus Maternal Aunt    Diabetes Maternal Aunt    Thyroid  disease Maternal Uncle    Asthma Maternal Grandmother    Cancer Maternal Grandmother        PANCREATIC   Hypertension Maternal Grandmother    Hypertension Maternal Grandfather    Heart disease Maternal Grandfather        quatrupal bypass    Lung cancer Maternal Grandfather 84   Prostate cancer Paternal Grandfather    Past Surgical History:  Procedure Laterality Date   bilateral eye retina surgery     almost detached retinas, surgery to prevent detachment   CHROMOPERTUBATION N/A 07/11/2020   Procedure: CHROMOPERTUBATION;  Surgeon: Darcel Pool, MD;  Location: [facility];  Service: Gynecology;  Laterality: N/A;   LAPAROSCOPY N/A 07/11/2020   Procedure: DIAGNOSTIC LAPAROSCOPY, PERITONEL BIOPSY;  Surgeon: Darcel Pool, MD;  Location: [facility];  Service: Gynecology;  Laterality: N/A;   WISDOM TOOTH EXTRACTION  05/25/2016   Social History[1] Social History   Social History Narrative   Lives by herself- no roommates.       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    A+T for undergrad and masters degree       Hobbies: time with family, tv- lifetime - enjoys laughing, napping     Immunization History  Administered Date(s) Administered   DTP 07/12/1990, 09/07/1990, 11/09/1990, 01/26/1992, 05/31/1995   Fluzone Influenza virus vaccine,trivalent (IIV3), split virus 09/06/2024   HIB (PRP-OMP) 07/12/1990, 09/07/1990, 11/09/1990, 08/11/1991   Hepatitis B 07/17/2002, 09/11/2002, 01/15/2003   Hpv-Unspecified 05/03/2008, 06/26/2008, 11/02/2008   Influenza,inj,Quad PF,6+ Mos 07/19/2019   Influenza-Unspecified 09/14/2016,  08/26/2017   MMR 08/11/1991, 05/31/1995   Meningococcal polysaccharide vaccine (MPSV4) 05/03/2008   OPV 07/12/1990, 09/07/1990, 01/26/1991, 05/31/1995   PFIZER(Purple Top)SARS-COV-2 Vaccination 12/08/2019, 12/30/2019, 08/29/2020, 03/11/2021   PPD Test 12/27/2013   Pfizer Covid-19 Vaccine Bivalent Booster 46yrs & up 09/05/2021   Td 06/17/2005   Tdap 12/27/2013, 11/03/2024   Unspecified SARS-COV-2 Vaccination 12/30/2019     Objective: Vital Signs: BP 126/87 (BP Location: Right Arm, Patient Position: Sitting, Cuff Size: Normal)   Pulse 78   Temp 98.6 F (37 C)   Resp 14   Ht 4' 10.75 (1.492 m)   Wt 148 lb (67.1 kg)   BMI 30.15 kg/m    Physical Exam Vitals and nursing note reviewed.  Constitutional:      Appearance: She is well-developed.  HENT:     Head: Normocephalic and atraumatic.  Eyes:     Conjunctiva/sclera: Conjunctivae normal.  Cardiovascular:     Rate and Rhythm: Normal rate and regular rhythm.  Heart sounds: Normal heart sounds.  Pulmonary:     Effort: Pulmonary effort is normal.     Breath sounds: Normal breath sounds.  Abdominal:     General: Bowel sounds are normal.     Palpations: Abdomen is soft.  Musculoskeletal:     Cervical back: Normal range of motion.  Lymphadenopathy:     Cervical: No cervical adenopathy.  Skin:    General: Skin is warm and dry.     Capillary Refill: Capillary refill takes less than 2 seconds.  Neurological:     Mental Status: She is alert and oriented to person, place, and time.  Psychiatric:        Behavior: Behavior normal.      Musculoskeletal Exam: Cervical, thoracic and lumbar spine were in good range of motion.  There was no SI joint tenderness.  Shoulder joints, elbow joints, wrist joints, MCPs, PIPs and DIPs were in good range of motion with no synovitis.  Hip joints and knee joints were in good range of motion without any warmth swelling or effusion.  There was no tenderness over ankles or MTPs.   CDAI  Exam: CDAI Score: -- Patient Global: --; Provider Global: -- Swollen: --; Tender: -- Joint Exam 11/14/2024   No joint exam has been documented for this visit   There is currently no information documented on the homunculus. Go to the Rheumatology activity and complete the homunculus joint exam.  Investigation: No additional findings.  Imaging: No results found.  Recent Labs: Lab Results  Component Value Date   WBC 6.9 04/25/2024   HGB 13.6 04/25/2024   PLT 250.0 04/25/2024   NA 138 07/08/2020   K 4.0 07/08/2020   CL 101 07/08/2020   CO2 25 07/08/2020   GLUCOSE 78 07/08/2020   BUN 10 07/08/2020   CREATININE 0.51 07/08/2020   BILITOT 0.2 (L) 07/31/2012   ALKPHOS 83 07/31/2012   AST 25 07/31/2012   ALT 15 07/31/2012   PROT 8.4 (H) 07/31/2012   ALBUMIN 4.1 07/31/2012   CALCIUM 10.1 07/08/2020   GFRAA >60 07/08/2020   April 25, 2024 ANA 1: 320 NS, vitamin D25.78, TSH normal, hemoglobin A1c 6.1  Speciality Comments: No specialty comments available.  Procedures:  No procedures performed Allergies: Mushroom, Mushroom extract complex (obsolete), and Latex   Assessment / Plan:     Visit Diagnoses: Positive ANA (antinuclear antibody) -patient gives history of fatigue for the last few years.  Also suffering from insomnia.  She denies any history of joint pain or muscle pain.  There is no history of oral ulcers, nasal ulcers, sicca symptoms, malar rash, photosensitivity, Raynaud's, lymphadenopathy or inflammatory arthritis.  No rash was noted on the examination.  She had good capillary refill with no nailbed capillary changes.  No sclerodactyly was noted.  Chest was clear to auscultation.  There is no history of shortness of breath.  There is a strong family history of lupus in maternal aunt and undifferentiated connective tissue disease in her mother.  Association of ANA with autoimmune disease was discussed.  I will obtain following labs today.  Plan: Protein / creatinine ratio,  urine, CBC with Differential/Platelet, Comprehensive metabolic panel with GFR, Sedimentation rate, ANA, Anti-scleroderma antibody, RNP Antibody, Anti-Smith antibody, Sjogrens syndrome-A extractable nuclear antibody, Sjogrens syndrome-B extractable nuclear antibody, Anti-DNA antibody, double-stranded, C3 and C4, Beta-2  glycoprotein antibodies, Cardiolipin antibodies, IgG, IgM, IgA.  Will discuss results at the follow-up visit.  Vitamin D  deficiency-patient states she was diagnosed with vitamin D  deficiency a  few months back.  She has taken over-the-counter vitamin D .  She continues to have low vitamin D .  She has a prescription of vitamin D  to pick up.  Moderate persistent asthma without complication-she was diagnosed with asthma many years ago.  She states her asthma is fairly well-controlled with inhalers.  Irritable bowel syndrome with constipation-patient states she was diagnosed with IBS by her PCP.  She has never had colonoscopy.  Other fatigue -she can history of fatigue for last several years.  She states she does not feel well rested and has insomnia.  Will obtain following labs.  Plan: CK, TSH, Serum protein electrophoresis with reflex  Primary insomnia-she been experiencing insomnia for the last few years.  Migraine without aura and without status migrainosus, not intractable-she states her migraines are manageable and she saw a neurologist in the past.  Chronic pelvic pain in female-patient is followed by GYN.  She states she had heavy.  Which improved after placement of IUD.  She was diagnosed with chronic pelvic pain.  She had laparoscopic evaluation which was negative for endometriosis.  Bilateral retinal lattice degeneration  Family history of systemic lupus erythematosus-maternal aunt, mother has positive ANA and chronic inflammatory arthritis  Orders: Orders Placed This Encounter  Procedures   Protein / creatinine ratio, urine   CBC with Differential/Platelet   Comprehensive  metabolic panel with GFR   Sedimentation rate   CK   TSH   ANA   Anti-scleroderma antibody   RNP Antibody   Anti-Smith antibody   Sjogrens syndrome-A extractable nuclear antibody   Sjogrens syndrome-B extractable nuclear antibody   Anti-DNA antibody, double-stranded   C3 and C4   Beta-2  glycoprotein antibodies   Cardiolipin antibodies, IgG, IgM, IgA   Serum protein electrophoresis with reflex   No orders of the defined types were placed in this encounter.    Follow-Up Instructions: Return for +ANA, fatigue.   Maya Nash, MD  Note - This record has been created using Animal nutritionist.  Chart creation errors have been sought, but may not always  have been located. Such creation errors do not reflect on  the standard of medical care.     [1]  Social History Tobacco Use   Smoking status: Never    Passive exposure: Never   Smokeless tobacco: Never  Vaping Use   Vaping status: Never Used  Substance Use Topics   Alcohol use: Yes    Comment: occasional   Drug use: Never   "

## 2024-11-02 ENCOUNTER — Ambulatory Visit: Payer: Self-pay

## 2024-11-02 NOTE — Telephone Encounter (Signed)
 Patient scheduled to see Dr. Katrinka 11/04/2023. Dr. Katrinka will review triage notes.

## 2024-11-02 NOTE — Telephone Encounter (Signed)
" ° °  FYI Only or Action Required?: FYI only for provider: appointment scheduled on 01.09.26.  Patient was last seen in primary care on 04/25/2024 by Lucius Krabbe, NP.  Called Nurse Triage reporting Fatigue.  Symptoms began 1 year.  Interventions attempted: OTC medications: melatonin.  Symptoms are: gradually worsening.  Triage Disposition: See PCP When Office is Open (Within 3 Days)  Patient/caregiver understands and will follow disposition?: Yes   Copied from CRM #[id number]. Topic: Clinical - Red Word Triage >> Nov 02, 2024  1:33 PM Antwanette L wrote: Red Word that prompted transfer to Nurse Triage: The patient is still experiencing fatigue and trouble slpeeing  and reports dealing with this for the past year. She has been seeing OB/GYN for chronic pelvic pain but is not improving. The patient is seeking a second opinion Reason for Disposition  [1] MILD weakness (e.g., does not interfere with ability to work, go to school, normal activities) AND [2] persists > 1 week  Answer Assessment - Initial Assessment Questions 1. DESCRIPTION: Describe how you are feeling.      Does not want to get out of bed, comes in waves  2. SEVERITY: How bad is it?  Can you stand and walk?     Worse since onset  3. ONSET: When did these symptoms begin? (e.g., hours, days, weeks, months)     X 1 year  4. CAUSE: What do you think is causing the weakness or fatigue? (e.g., not drinking enough fluids, medical problem, trouble sleeping)     Unsure.   5. NEW MEDICINES:  Have you started on any new medicines recently? (e.g., opioid pain medicines, benzodiazepines, muscle relaxants, antidepressants, antihistamines, neuroleptics, beta blockers)      Denies new medications  6. OTHER SYMPTOMS:        Pt denies chest pain, fever, cough, SOB, vomiting, diarrhea, bleeding, other areas of pain        7. PREGNANCY: Is there any chance you are pregnant? When was your last menstrual period?       Denies  Pt reports fatigue, chronic pelvic pain, and insomnia Pt is taking OTC Melatonin Pt scheduled for a visit on 01.09.26  for further evaluation. Pt agrees with plan of care, will call back for any worsening symptoms  Protocols used: Weakness (Generalized) and Fatigue-A-AH  "

## 2024-11-03 ENCOUNTER — Encounter: Payer: Self-pay | Admitting: Family Medicine

## 2024-11-03 ENCOUNTER — Ambulatory Visit: Admitting: Family Medicine

## 2024-11-03 VITALS — BP 118/78 | Temp 98.0°F | Ht 59.0 in | Wt 146.6 lb

## 2024-11-03 DIAGNOSIS — R102 Pelvic and perineal pain unspecified side: Secondary | ICD-10-CM

## 2024-11-03 DIAGNOSIS — R0683 Snoring: Secondary | ICD-10-CM | POA: Diagnosis not present

## 2024-11-03 DIAGNOSIS — G8929 Other chronic pain: Secondary | ICD-10-CM | POA: Diagnosis not present

## 2024-11-03 DIAGNOSIS — E559 Vitamin D deficiency, unspecified: Secondary | ICD-10-CM | POA: Diagnosis not present

## 2024-11-03 DIAGNOSIS — Z23 Encounter for immunization: Secondary | ICD-10-CM

## 2024-11-03 DIAGNOSIS — G4700 Insomnia, unspecified: Secondary | ICD-10-CM

## 2024-11-03 DIAGNOSIS — R5383 Other fatigue: Secondary | ICD-10-CM | POA: Diagnosis not present

## 2024-11-03 MED ORDER — ALBUTEROL SULFATE HFA 108 (90 BASE) MCG/ACT IN AERS: 2.0000 | INHALATION_SPRAY | Freq: Four times a day (QID) | RESPIRATORY_TRACT | 3 refills | Status: AC | PRN

## 2024-11-03 MED ORDER — VITAMIN D (ERGOCALCIFEROL) 1.25 MG (50000 UNIT) PO CAPS: 50000.0000 [IU] | ORAL_CAPSULE | ORAL | 0 refills | Status: AC

## 2024-11-03 NOTE — Progress Notes (Signed)
 " Phone [phone] In person visit   Subjective:   Kylie Olson is a 34 y.o. year old very pleasant female patient who presents for/with See problem oriented charting Chief Complaint  Patient presents with   Fatigue    Fatigue; trouble slpeeing; reports symptoms for the past year; pt has been seeing OB/GYN for chronic pelvic pain but not improving; would like a second opinion; requesting pap results from [facility]; okay for tdap with current symptoms?     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     Flaxseed, Linseed, (FLAX SEEDS PO) Take by mouth daily.      levocetirizine (XYZAL) 5 MG tablet Take 5 mg by mouth every evening.     levonorgestrel (MIRENA) 20 MCG/24HR IUD 1 each by Intrauterine route once.     rizatriptan  (MAXALT ) 5 MG tablet TAKE 1 TABLET IF NEEDED FOR MIGRAINES MAY REPEAT IN 2 HOURS IF NEEDED 10 tablet 5   Vitamin D , Ergocalciferol , (DRISDOL ) 1.25 MG (50000 UNIT) CAPS capsule Take 1 capsule (50,000 Units total) by mouth every 7 (seven) days. 12 capsule 0   albuterol  (VENTOLIN  HFA) 108 (90 Base) MCG/ACT inhaler Inhale 2 puffs into the lungs every 6 (six) hours as needed for wheezing or shortness of breath. 18 g 3   No current facility-administered medications for this visit.     Objective:  BP 118/78 (BP Location: Left Arm, Patient Position: Sitting, Cuff Size: Normal)   Temp 98 F (36.7 C) (Temporal)   Ht 4' 11 (1.499 m)   Wt 146 lb 9.6 oz (66.5 kg)   BMI 29.61 kg/m   Gen: NAD, resting comfortably CV: RRR no murmurs rubs or gallops Lungs: CTAB no crackles, wheeze, rhonchi Abdomen: soft/nontender other than moderate pain with palpation and left lower quadrant, right lower quadrant and suprapubic/nondistended/normal bowel sounds. No rebound or guarding.  Ext: no edema Skin: warm, dry     Assessment and Plan   #health maintenance - Tetanus, Diphtheria, and Pertussis (Tdap) given today  # Fatigue # Chronic abdominal pain S:patient with fatigue issues for the last year. She has had sleep and fatigue issues during this time as well- looking at note from about 3 years ago she was concerned about sleep, pelvic pain  (seeing GYN) at that point as well but fatigue portion is new in last year. Sleep issues vary- sometimes a problem sometimes not- but regardless fatigue persists. If issues can be severe including overnight insomnia and sees the sun rise.   She was seen last July by Corean Comment, NP and ANA eleated ANA 1: 320 referred to rheumatologist- was not able to be seen until later this month. She also has noted prediabetes  with a1c above 6. Her vitamin D  was low - is on 2000 units. TSH normal. No significant joint pain issues.   She has dealt with chronic pelvic pain and seeing [facility]  but not improving and shed like to get a second opinion. She has even  had exploratory sugery which showed endometriosis on exam sometime near 2020  but biopsy negative. Serapio raised her liver enzymes and did not help per my last note- she also tried myfembree. Prior NSAIDs, depo medrol, IUD since 2021 (bleeding control). Lupron had been considered. She did have simple cyst April 2025 on ultrasound in ovary with 2 month repeat planned. On repeat in July bilateral simple cyst- plan for 4 month repeat and thought related to IUD. GC/CT tested July and negative. IUD replaced 05/23/24. -pain described as bilateral cramping lower abdomen but also gets shooting vaginal pains at times.  Most days 4-5/10 and up to 7-8/10. Heating peripheral arterial disease helps some to rest but obviously shouldn't sleep with that -pain back in [id number] was before IUD- back then pain had been so severe at times caused syncope. She had CT then but none since that time.   LFTs have normalized 09/16/23 after bump on medicine in the past  Mild snoring A/P:  # Chronic fatigue  -Patient with ongoing fatigue for the last year.  Labs largely reassuring including CBC, BMP, LFTs, TSH during this time-other than elevated ANA (without any other specific symptoms to help us  pin down diagnosis -She also has low vitamin D  which could contribute-see section below - She does report some snoring and ongoing insomnia issues-refer to pulmonology for sleep apnea evaluation  # Chronic pelvic pain-question endometriosis - Last CT was in 2013 and offered repeat but she wants hold off at this time and see what GYN says - She would like second opinion and placed referral to [facility].  Also consider Dr. Rosalva once she establishes with [facility] if any issues getting plugged in -I have had several patients with significant benefit with pelvic floor physical therapy both men and women so offered referral there-[facility]  #Vitamin D  deficiency S: Medication: OTC (available over the counter without a prescription) thinks taking 2000 units- she's not sure if was taking at time of test Last vitamin D  Lab Results  Component Value Date   VD25OH 25.78 (L) 04/25/2024  A/P: with prior lows and ongoing fatigue we opted T12 weeks of high dose vitamin D  then return to 2000 units per day for maintenance  # Asthma-no recent issues but we opted to refill her albuterol   Recommended follow up: Return in about 4 months (around 03/03/2025) for physical or sooner if needed.Schedule b4 you leave. Future Appointments  Date Time Provider Department Center  11/14/2024  8:20 AM Dolphus Reiter, MD [facility] None   12/14/2024  9:40 AM Dolphus Reiter, MD [facility] None  03/05/2025  2:20 PM Katrinka Garnette KIDD, MD [name]    Lab/Order associations:   ICD-10-CM   1. Chronic pelvic pain in female  R10.20 Ambulatory referral to Gynecology   G89.29 Ambulatory referral to Physical Therapy    2. Snoring  R06.83 Ambulatory referral to Pulmonology    3. Fatigue, unspecified type  R53.83 Ambulatory referral to Pulmonology    4. Insomnia, unspecified type  G47.00 Ambulatory referral to Pulmonology    5. Vitamin D  deficiency  E55.9     6. Immunization due  Z23 Tdap vaccine greater than or equal to 7yo IM      Meds ordered this encounter  Medications   albuterol  (VENTOLIN  HFA) 108 (90 Base) MCG/ACT inhaler    Sig: Inhale 2 puffs into the lungs every 6 (six) hours as needed for wheezing or shortness of breath.    Dispense:  18  g    Refill:  3   Vitamin D , Ergocalciferol , (DRISDOL ) 1.25 MG (50000 UNIT) CAPS capsule    Sig: Take 1 capsule (50,000 Units total) by mouth every 7 (seven) days.    Dispense:  12 capsule    Refill:  0   I personally spent a total of 42 minutes in the care of the patient today including preparing to see the patient including extensive review of prior workup, getting/reviewing separately obtained history, performing a medically appropriate exam/evaluation, counseling and educating and discussing workup steps, placing orders, and documenting clinical information in the EHR.   Return precautions advised.  Garnette Lukes, MD  "

## 2024-11-03 NOTE — Patient Instructions (Addendum)
 Health Maintenance Due  Topic Date Due   DTaP/Tdap/Td (8 - Td or Tdap) 12/28/2023  Today  Trial vitamin D  high dose for 12 weeks and restart 2000 units afterwards  We have placed a referral for you today to pulmonary (sleep study), gynecology ([facility]), pelvic floor physical therapy with [facility]- please call their # if you do not hear within a week (may be listed below or you may see mychart message within a few days with #).   [facility] Address: [address], [place], [place] [address] Phone: [phone]  Recommended follow up: Return in about 4 months (around 03/03/2025) for physical or sooner if needed.Schedule b4 you leave.

## 2024-11-14 ENCOUNTER — Encounter: Payer: Self-pay | Admitting: Rheumatology

## 2024-11-14 ENCOUNTER — Ambulatory Visit: Attending: Rheumatology | Admitting: Rheumatology

## 2024-11-14 VITALS — BP 126/87 | HR 78 | Temp 98.6°F | Resp 14 | Ht 58.75 in | Wt 148.0 lb

## 2024-11-14 DIAGNOSIS — R7689 Other specified abnormal immunological findings in serum: Secondary | ICD-10-CM

## 2024-11-14 DIAGNOSIS — G8929 Other chronic pain: Secondary | ICD-10-CM | POA: Diagnosis not present

## 2024-11-14 DIAGNOSIS — K581 Irritable bowel syndrome with constipation: Secondary | ICD-10-CM

## 2024-11-14 DIAGNOSIS — G43009 Migraine without aura, not intractable, without status migrainosus: Secondary | ICD-10-CM | POA: Diagnosis not present

## 2024-11-14 DIAGNOSIS — R102 Pelvic and perineal pain unspecified side: Secondary | ICD-10-CM | POA: Diagnosis not present

## 2024-11-14 DIAGNOSIS — F5101 Primary insomnia: Secondary | ICD-10-CM

## 2024-11-14 DIAGNOSIS — H35413 Lattice degeneration of retina, bilateral: Secondary | ICD-10-CM | POA: Diagnosis not present

## 2024-11-14 DIAGNOSIS — J4540 Moderate persistent asthma, uncomplicated: Secondary | ICD-10-CM

## 2024-11-14 DIAGNOSIS — G4700 Insomnia, unspecified: Secondary | ICD-10-CM

## 2024-11-14 DIAGNOSIS — E559 Vitamin D deficiency, unspecified: Secondary | ICD-10-CM

## 2024-11-14 DIAGNOSIS — Z8269 Family history of other diseases of the musculoskeletal system and connective tissue: Secondary | ICD-10-CM

## 2024-11-14 DIAGNOSIS — R5383 Other fatigue: Secondary | ICD-10-CM

## 2024-11-14 DIAGNOSIS — K582 Mixed irritable bowel syndrome: Secondary | ICD-10-CM

## 2024-11-16 ENCOUNTER — Ambulatory Visit: Payer: Self-pay | Admitting: Rheumatology

## 2024-11-16 LAB — COMPREHENSIVE METABOLIC PANEL WITH GFR
AG Ratio: 1.5 (calc) (ref 1.0–2.5)
ALT: 33 U/L — ABNORMAL HIGH (ref 6–29)
AST: 25 U/L (ref 10–30)
Albumin: 5.1 g/dL (ref 3.6–5.1)
Alkaline phosphatase (APISO): 85 U/L (ref 31–125)
BUN: 11 mg/dL (ref 7–25)
CO2: 24 mmol/L (ref 20–32)
Calcium: 9.8 mg/dL (ref 8.6–10.2)
Chloride: 101 mmol/L (ref 98–110)
Creat: 0.69 mg/dL (ref 0.50–0.97)
Globulin: 3.3 g/dL (ref 1.9–3.7)
Glucose, Bld: 95 mg/dL (ref 65–99)
Potassium: 4.3 mmol/L (ref 3.5–5.3)
Sodium: 136 mmol/L (ref 135–146)
Total Bilirubin: 0.3 mg/dL (ref 0.2–1.2)
Total Protein: 8.4 g/dL — ABNORMAL HIGH (ref 6.1–8.1)
eGFR: 117 mL/min/1.73m2

## 2024-11-16 LAB — CBC WITH DIFFERENTIAL/PLATELET
Absolute Lymphocytes: 2462 {cells}/uL (ref 850–3900)
Absolute Monocytes: 464 {cells}/uL (ref 200–950)
Basophils Absolute: 53 {cells}/uL (ref 0–200)
Basophils Relative: 0.7 %
Eosinophils Absolute: 114 {cells}/uL (ref 15–500)
Eosinophils Relative: 1.5 %
HCT: 43 % (ref 35.9–46.0)
Hemoglobin: 13.8 g/dL (ref 11.7–15.5)
MCH: 27.4 pg (ref 27.0–33.0)
MCHC: 32.1 g/dL (ref 31.6–35.4)
MCV: 85.5 fL (ref 81.4–101.7)
MPV: 12.6 fL — ABNORMAL HIGH (ref 7.5–12.5)
Monocytes Relative: 6.1 %
Neutro Abs: 4507 {cells}/uL (ref 1500–7800)
Neutrophils Relative %: 59.3 %
Platelets: 256 Thousand/uL (ref 140–400)
RBC: 5.03 Million/uL (ref 3.80–5.10)
RDW: 13.3 % (ref 11.0–15.0)
Total Lymphocyte: 32.4 %
WBC: 7.6 Thousand/uL (ref 3.8–10.8)

## 2024-11-16 LAB — CARDIOLIPIN ANTIBODIES, IGG, IGM, IGA
Anticardiolipin IgA: <2 [APL'U]/mL
Anticardiolipin IgG: <2 [GPL'U]/mL
Anticardiolipin IgM: 4.4 [MPL'U]/mL

## 2024-11-16 LAB — CK: Total CK: 251 U/L — ABNORMAL HIGH (ref 20–239)

## 2024-11-16 LAB — SEDIMENTATION RATE: Sed Rate: 31 mm/h — ABNORMAL HIGH (ref 0–20)

## 2024-11-16 LAB — PROTEIN ELECTROPHORESIS, SERUM, WITH REFLEX
Albumin ELP: 4.8 g/dL (ref 3.8–4.8)
Alpha 1: 0.3 g/dL (ref 0.2–0.3)
Alpha 2: 0.9 g/dL (ref 0.5–0.9)
Beta 2: 0.5 g/dL (ref 0.2–0.5)
Beta Globulin: 0.5 g/dL (ref 0.4–0.6)
Gamma Globulin: 1.2 g/dL (ref 0.8–1.7)
Total Protein: 8.2 g/dL — ABNORMAL HIGH (ref 6.1–8.1)

## 2024-11-16 LAB — BETA-2 GLYCOPROTEIN ANTIBODIES
Beta-2 Glyco 1 IgA: <2 U/mL
Beta-2 Glyco 1 IgM: 4.6 U/mL
Beta-2 Glyco I IgG: <2 U/mL

## 2024-11-16 LAB — C3 AND C4
C3 Complement: 196 mg/dL — ABNORMAL HIGH (ref 83–193)
C4 Complement: 44 mg/dL (ref 15–57)

## 2024-11-16 LAB — ANTI-DNA ANTIBODY, DOUBLE-STRANDED: ds DNA Ab: 1 [IU]/mL

## 2024-11-16 LAB — PROTEIN / CREATININE RATIO, URINE
Creatinine, Urine: 20 mg/dL (ref 20–275)
Total Protein, Urine: <4 mg/dL — ABNORMAL LOW (ref 5–24)

## 2024-11-16 LAB — SJOGRENS SYNDROME-A EXTRACTABLE NUCLEAR ANTIBODY: SSA (Ro) (ENA) Antibody, IgG: <1 AI

## 2024-11-16 LAB — TSH: TSH: 2 m[IU]/L

## 2024-11-16 LAB — ANTI-SCLERODERMA ANTIBODY: Scleroderma (Scl-70) (ENA) Antibody, IgG: <1 AI

## 2024-11-16 LAB — ANTI-SMITH ANTIBODY: ENA SM Ab Ser-aCnc: <1 AI

## 2024-11-16 LAB — RNP ANTIBODY: Ribonucleic Protein(ENA) Antibody, IgG: <1 AI

## 2024-11-16 LAB — ANTI-NUCLEAR AB-TITER (ANA TITER)
ANA TITER: 1:320 {titer} — ABNORMAL HIGH
ANA Titer 1: 1:320 {titer} — ABNORMAL HIGH

## 2024-11-16 LAB — ANA: Anti Nuclear Antibody (ANA): POSITIVE — AB

## 2024-11-16 LAB — SJOGRENS SYNDROME-B EXTRACTABLE NUCLEAR ANTIBODY: SSB (La) (ENA) Antibody, IgG: <1 AI

## 2024-11-16 NOTE — Progress Notes (Signed)
 Urine protein creatinine ratio normal, CBC normal, CMP be shows elevated liver function.  Patient should avoid NSAIDs and alcohol use.  Sed rate is mildly elevated.  CK is mildly elevated and not significant.  Complements are normal.  TSH normal.  ENA panel negative, anticardiolipin and beta-2  GP 1 antibodies negative, SPEP and ANA pending.  I will discuss results at the follow-up visit.

## 2024-11-17 ENCOUNTER — Telehealth: Payer: Self-pay | Admitting: Family Medicine

## 2024-11-17 NOTE — Progress Notes (Signed)
 SPEP normal, ANA remains positive.

## 2024-11-17 NOTE — Telephone Encounter (Signed)
 Copy of Tdap vaccine sent to patient via MyChart.   Copied from CRM #[id number]. Topic: General - Other >> Nov 17, 2024 11:36 AM Deleta RAMAN wrote: Reason for CRM: patient would like a record of tdap vaccine. She would like for the information to be sent over to her. Please contact patient request

## 2024-11-19 NOTE — Progress Notes (Unsigned)
 "  Office Visit Note  Patient: Kylie Olson             Date of Birth: 03-Apr-1991           MRN: [id number]             PCP: Katrinka Garnette KIDD, MD Referring: Katrinka Garnette KIDD, MD Visit Date: 11/22/2024 Occupation: Data Unavailable  Subjective:  No chief complaint on file.   History of Present Illness: Kylie Olson is a 34 y.o. female ***     Activities of Daily Living:  Patient reports morning stiffness for *** {minute/hour:19697}.   Patient {ACTIONS;DENIES/REPORTS:21021675::Denies} nocturnal pain.  Difficulty dressing/grooming: {ACTIONS;DENIES/REPORTS:21021675::Denies} Difficulty climbing stairs: {ACTIONS;DENIES/REPORTS:21021675::Denies} Difficulty getting out of chair: {ACTIONS;DENIES/REPORTS:21021675::Denies} Difficulty using hands for taps, buttons, cutlery, and/or writing: {ACTIONS;DENIES/REPORTS:21021675::Denies}  No Rheumatology ROS completed.   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   Migraines    Pelvic pain in female    PONV (postoperative nausea and vomiting)    Wears contact lenses     Family History  Problem Relation Age of Onset   Diabetes Mother    Irritable bowel syndrome Mother    Hypertension Mother    Heart disease Mother        heart surgery at age 9   Other Father        Subarachnoid hemorrhage   Diabetes Father    Hypertension Maternal Aunt    Lupus Maternal Aunt    Diabetes Maternal  Aunt    Thyroid  disease Maternal Uncle    Asthma Maternal Grandmother    Cancer Maternal Grandmother        PANCREATIC   Hypertension Maternal Grandmother    Hypertension Maternal Grandfather    Heart disease Maternal Grandfather        quatrupal bypass    Lung cancer Maternal Grandfather 84   Prostate cancer Paternal Grandfather    Past Surgical History:  Procedure Laterality Date   bilateral eye retina surgery     almost detached retinas, surgery to prevent detachment   CHROMOPERTUBATION N/A 07/11/2020   Procedure: CHROMOPERTUBATION;  Surgeon: Darcel Pool, MD;  Location: [facility];  Service: Gynecology;  Laterality: N/A;   LAPAROSCOPY N/A 07/11/2020   Procedure: DIAGNOSTIC LAPAROSCOPY, PERITONEL BIOPSY;  Surgeon: Darcel Pool, MD;  Location: [facility];  Service: Gynecology;  Laterality: N/A;   WISDOM TOOTH EXTRACTION  05/25/2016   Social History[1] Social History   Social History Narrative   Lives by herself- no roommates.       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    [employer] for undergrad and masters degree       Hobbies: time with family, tv- lifetime - enjoys laughing, napping     Immunization History  Administered Date(s) Administered   DTP 07/12/1990, 09/07/1990, 11/09/1990, 01/26/1992, 05/31/1995   Fluzone Influenza virus vaccine,trivalent (IIV3), split virus  09/06/2024   HIB (PRP-OMP) 07/12/1990, 09/07/1990, 11/09/1990, 08/11/1991   Hepatitis B 07/17/2002, 09/11/2002, 01/15/2003   Hpv-Unspecified 05/03/2008, 06/26/2008, 11/02/2008   Influenza,inj,Quad PF,6+ Mos 07/19/2019   Influenza-Unspecified 09/14/2016, 08/26/2017   MMR 08/11/1991, 05/31/1995   Meningococcal polysaccharide vaccine (MPSV4) 05/03/2008   OPV 07/12/1990, 09/07/1990, 01/26/1991, 05/31/1995   PFIZER(Purple Top)SARS-COV-2 Vaccination 12/08/2019, 12/30/2019, 08/29/2020, 03/11/2021   PPD Test 12/27/2013   Pfizer Covid-19 Vaccine Bivalent  Booster 63yrs & up 09/05/2021   Td 06/17/2005   Tdap 12/27/2013, 11/03/2024   Unspecified SARS-COV-2 Vaccination 12/30/2019     Objective: Vital Signs: There were no vitals taken for this visit.   Physical Exam   Musculoskeletal Exam: ***  CDAI Exam: CDAI Score: -- Patient Global: --; Provider Global: -- Swollen: --; Tender: -- Joint Exam 11/22/2024   No joint exam has been documented for this visit   There is currently no information documented on the homunculus. Go to the Rheumatology activity and complete the homunculus joint exam.  Investigation: No additional findings.  Imaging: No results found.  Recent Labs: Lab Results  Component Value Date   WBC 7.6 11/14/2024   HGB 13.8 11/14/2024   PLT 256 11/14/2024   NA 136 11/14/2024   K 4.3 11/14/2024   CL 101 11/14/2024   CO2 24 11/14/2024   GLUCOSE 95 11/14/2024   BUN 11 11/14/2024   CREATININE 0.69 11/14/2024   BILITOT 0.3 11/14/2024   ALKPHOS 83 07/31/2012   AST 25 11/14/2024   ALT 33 (H) 11/14/2024   PROT 8.4 (H) 11/14/2024   PROT 8.2 (H) 11/14/2024   ALBUMIN 4.1 07/31/2012   CALCIUM 9.8 11/14/2024   GFRAA >60 07/08/2020   11/14/24 SPEP normal, urine protein creatinine ratio normal, ANA 1: 320 N H, 1: 320 NS, ENA (SCL 70, RNP, Smith, SSA, SSB, dsDNA) negative, beta-2  GP 1 negative, anticardiolipin negative , C3-C4 normal, sed rate 31, CK 251, TSH normal Speciality Comments: No specialty comments available.  Procedures:  No procedures performed Allergies: Mushroom, Mushroom extract complex (obsolete), and Latex   Assessment / Plan:     Visit Diagnoses: No diagnosis found.  Orders: No orders of the defined types were placed in this encounter.  No orders of the defined types were placed in this encounter.   Face-to-face time spent with patient was *** minutes. Greater than 50% of time was spent in counseling and coordination of care.  Follow-Up Instructions: No follow-ups on file.   Maya Nash, MD  Note - This record has been created using Animal nutritionist.  Chart creation errors have been sought, but may not always  have been located. Such creation errors do not reflect on  the standard of medical care.    [1]  Social History Tobacco Use   Smoking status: Never    Passive exposure: Never   Smokeless tobacco: Never  Vaping Use   Vaping status: Never Used  Substance Use Topics   Alcohol use: Yes    Comment: occasional   Drug use: Never   "

## 2024-11-22 ENCOUNTER — Ambulatory Visit: Attending: Rheumatology | Admitting: Rheumatology

## 2024-11-22 ENCOUNTER — Encounter: Payer: Self-pay | Admitting: Rheumatology

## 2024-11-22 VITALS — BP 136/86 | HR 98 | Temp 98.7°F | Resp 15 | Ht 59.0 in | Wt 147.2 lb

## 2024-11-22 DIAGNOSIS — Z8269 Family history of other diseases of the musculoskeletal system and connective tissue: Secondary | ICD-10-CM

## 2024-11-22 DIAGNOSIS — G43009 Migraine without aura, not intractable, without status migrainosus: Secondary | ICD-10-CM

## 2024-11-22 DIAGNOSIS — H35413 Lattice degeneration of retina, bilateral: Secondary | ICD-10-CM

## 2024-11-22 DIAGNOSIS — R102 Pelvic and perineal pain unspecified side: Secondary | ICD-10-CM | POA: Diagnosis not present

## 2024-11-22 DIAGNOSIS — J4540 Moderate persistent asthma, uncomplicated: Secondary | ICD-10-CM

## 2024-11-22 DIAGNOSIS — R7689 Other specified abnormal immunological findings in serum: Secondary | ICD-10-CM

## 2024-11-22 DIAGNOSIS — R5383 Other fatigue: Secondary | ICD-10-CM | POA: Diagnosis not present

## 2024-11-22 DIAGNOSIS — K581 Irritable bowel syndrome with constipation: Secondary | ICD-10-CM | POA: Diagnosis not present

## 2024-11-22 DIAGNOSIS — G8929 Other chronic pain: Secondary | ICD-10-CM | POA: Diagnosis not present

## 2024-11-22 DIAGNOSIS — E559 Vitamin D deficiency, unspecified: Secondary | ICD-10-CM | POA: Diagnosis not present

## 2024-11-22 DIAGNOSIS — F5101 Primary insomnia: Secondary | ICD-10-CM

## 2024-11-22 NOTE — Patient Instructions (Signed)
 Standing Labs We placed an order today for your standing lab work.   Please have your standing labs drawn in January  Please have your labs drawn 2 weeks prior to your appointment so that the provider can discuss your lab results at your appointment, if possible.  Please note that you may see your imaging and lab results in MyChart before we have reviewed them. We will contact you once all results are reviewed. Please allow our office up to 72 hours to thoroughly review all of the results before contacting the office for clarification of your results.  WALK-IN LAB HOURS  Monday through Thursday from 8:00 am - 4:30 pm and Friday from 8:00 am-12:00 pm.  Patients with office visits requiring labs will be seen before walk-in labs.  You may encounter longer than normal wait times. Please allow additional time. Wait times may be shorter on  Monday and Thursday afternoons.  We do not book appointments for walk-in labs. We appreciate your patience and understanding with our staff.   Labs are drawn by [facility]. Please bring your co-pay at the time of your lab draw.  You may receive a bill from [facility] for your lab work.  Please note if you are on Hydroxychloroquine and and an order has been placed for a Hydroxychloroquine level,  you will need to have it drawn 4 hours or more after your last dose.  If you wish to have your labs drawn at another location, please call the office 24 hours in advance so we can fax the orders.  The office is located at [address], [place], [place] [address]   If you have any questions regarding directions or hours of operation,  please call [phone].   As a reminder, please drink plenty of water prior to coming for your lab work. Thanks!

## 2024-12-14 ENCOUNTER — Ambulatory Visit: Admitting: Rheumatology

## 2024-12-14 ENCOUNTER — Ambulatory Visit: Admitting: Pulmonary Disease

## 2025-03-05 ENCOUNTER — Encounter: Admitting: Family Medicine

## 2025-11-22 ENCOUNTER — Ambulatory Visit: Admitting: Rheumatology
